# Patient Record
Sex: Male | Born: 1970
Health system: Southern US, Community
[De-identification: ages and names within clinical notes are randomized; demographics above are authoritative.]

## PROBLEM LIST (undated history)

## (undated) DIAGNOSIS — Z8619 Personal history of other infectious and parasitic diseases: Secondary | ICD-10-CM

## (undated) HISTORY — DX: Personal history of other infectious and parasitic diseases: Z86.19

---

## 2007-08-09 HISTORY — PX: SPINE SURGERY: SHX786

## 2007-11-14 ENCOUNTER — Emergency Department: Payer: Self-pay | Admitting: Emergency Medicine

## 2007-11-19 ENCOUNTER — Encounter: Admission: RE | Admit: 2007-11-19 | Discharge: 2007-11-19 | Payer: Self-pay | Admitting: Neurosurgery

## 2007-12-19 ENCOUNTER — Encounter: Admission: RE | Admit: 2007-12-19 | Discharge: 2007-12-19 | Payer: Self-pay | Admitting: Neurosurgery

## 2008-01-02 ENCOUNTER — Encounter: Admission: RE | Admit: 2008-01-02 | Discharge: 2008-01-02 | Payer: Self-pay | Admitting: Neurosurgery

## 2008-02-26 ENCOUNTER — Observation Stay (HOSPITAL_COMMUNITY): Admission: RE | Admit: 2008-02-26 | Discharge: 2008-02-27 | Payer: Self-pay | Admitting: Neurosurgery

## 2010-12-21 NOTE — Op Note (Signed)
NAME:  Evan Kelley, Evan Kelley NO.:  [id number]   MEDICAL RECORD NO.:  [id number]          PATIENT TYPE:  OBV   LOCATION:  3534                         FACILITY:  [facility]   PHYSICIAN:  Hilda Lias, M.D.   DATE OF BIRTH:  1971/06/07   DATE OF PROCEDURE:  02/26/2008  DATE OF DISCHARGE:                               OPERATIVE REPORT   PREOPERATIVE DIAGNOSIS:  Left L4-L5 herniated disk with chronic,  subacute, and acute radiculopathy.   POSTOPERATIVE DIAGNOSIS:  Left L4-L5 herniated disk with chronic,  subacute, and acute radiculopathy.   PROCEDURE:  Left L4-L5 removal of two large fragment of disk.  Foraminotomy.  Microscope.   SURGEON:  Hilda Lias, MD   ASSISTANT:  Stefani Dama, MD.   CLINICAL HISTORY:  The patient was seen in my office for several months  complaints of back and left leg pain associated with weakness of the  left foot.  The patient has failed with conservative treatment.  X-rays  showed that he has a herniated disk at the level of L4-L5 to the left  and incidental to left to right.  Surgery was advised.  The risks were  explained in the history and physical.   PROCEDURE:  The patient was taken to the OR and after intubation, he was  positioned in prone manner.  Back was cleaned with DuraPrep.  X-rays  showed that the needle was at the level L4.  Then, midline incision from  L4-L5 was made and muscle was retracted laterally.  Then with the  microscope, we drilled the lower lamina of L4 on the upper of L5 and  resection of the yellow ligament.  We found that the L5 nerve root was  completely adherent to the floor.  Lysis was accomplished and there was  a piece of osteophyte, which was removed.  Immediately after we were  able to lift the L5 nerve root, we found to large fragment going to the  foramen.  Removal was done without any problem.  We looked at the disk  space and the annulus was completely closed and there was no evidence of  any  hole.  Because of that we decided not to proceed with diskectomy.  Having good decompression of the L5 nerve root and the L4 foraminotomy  was accomplished.  Valsalva maneuver was negative.  Fentanyl and Depo-  Medrol were left in the epidural space and the wound was closed with  Vicryl and Steri-Strips.           ______________________________  Hilda Lias, M.D.     EB/MEDQ  D:  02/26/2008  T:  02/27/2008  Job:  [id number]

## 2010-12-21 NOTE — H&P (Signed)
NAME:  Evan Kelley, Evan Kelley NO.:  [id number]   MEDICAL RECORD NO.:  [id number]          PATIENT TYPE:  OBV   LOCATION:  3534                         FACILITY:  [facility]   PHYSICIAN:  Hilda Lias, M.D.   DATE OF BIRTH:  28-Dec-1970   DATE OF ADMISSION:  02/26/2008  DATE OF DISCHARGE:                              HISTORY & PHYSICAL   HISTORY OF PRESENT ILLNESS:  Evan Kelley is a gentleman who had been  complaining of back pain with radiation down to the left leg associated  with weakness.  The patient had conservative treatment including  medications, physical therapy, and he is not any better.  He came to my  office twice.  He is feeling that the pain is a little bit better in the  left foot, but still remains quite a bit in the left buttock area  associated with weakness.  The patient denies any pain in the right leg.  He wanted to proceed with surgery because of no improvement.   PAST MEDICAL HISTORY:  Negative.   ALLERGIES:  He is not allergic to any medication.   SOCIAL HISTORY:  He drinks socially.  He does not smoke.   FAMILY HISTORY:  Positive for high cholesterol.   REVIEW OF SYSTEMS:  Positive for left leg pain.   PHYSICAL EXAMINATION:  The patient came to my office, he is __________  scoliosis.  HEAD, EARS, NOSE, AND THROAT:  Normal.  NECK:  Normal.  LUNGS:  Clear.  HEART:  Heart sounds normal.  EXTREMITIES:  There were normal strength and normal pulses.  NEUROLOGIC:  Show weak dorsiflexion in the left foot with straight leg  raising being positive at 30 degrees.  He has had decrease of his  symptoms in lumbar spine.   X-ray showed that he has a large herniated disk at L4-L5 to the left and  incidental one at the L4-L5 to his right.   IMPRESSION:  Left L4-L5 herniated disk, and incidental right L5-S1.   RECOMMENDATIONS:  The patient is being admitted for surgery.  He has  failed conservative treatment.  __________ for 5 months and does not  feel any  better.  The procedure would be a left L4-L5 diskectomy.  Surgery was fully explained to he and his wife including the possibility  of no improvement whatsoever, infection, CSF leak.           ______________________________  Hilda Lias, M.D.    EB/MEDQ  D:  02/26/2008  T:  02/26/2008  Job:  [id number]

## 2011-05-06 LAB — CBC
HCT: 40.7
Hemoglobin: 14.2
MCHC: 34.9
MCV: 89.7
Platelets: 230
RBC: 4.54
RDW: 12.6
WBC: 6.8

## 2011-09-13 ENCOUNTER — Ambulatory Visit: Payer: Self-pay | Admitting: Family Medicine

## 2011-09-14 ENCOUNTER — Ambulatory Visit (INDEPENDENT_AMBULATORY_CARE_PROVIDER_SITE_OTHER): Payer: BC Managed Care – PPO | Admitting: Family Medicine

## 2011-09-14 ENCOUNTER — Encounter: Payer: Self-pay | Admitting: Family Medicine

## 2011-09-14 VITALS — BP 140/92 | HR 77 | Temp 97.6°F | Ht 75.0 in | Wt 200.0 lb

## 2011-09-14 DIAGNOSIS — Z0000 Encounter for general adult medical examination without abnormal findings: Secondary | ICD-10-CM

## 2011-09-14 LAB — BASIC METABOLIC PANEL
BUN: 15 mg/dL (ref 6–23)
CO2: 28 mEq/L (ref 19–32)
Calcium: 9.3 mg/dL (ref 8.4–10.5)
Chloride: 104 mEq/L (ref 96–112)
Creatinine, Ser: 1.1 mg/dL (ref 0.4–1.5)
GFR: 77.8 mL/min (ref >60.00)
Glucose, Bld: 83 mg/dL (ref 70–99)
Potassium: 4.2 mEq/L (ref 3.5–5.1)
Sodium: 140 mEq/L (ref 135–145)

## 2011-09-14 LAB — CBC WITH DIFFERENTIAL/PLATELET
Basophils Absolute: 0 10*3/uL (ref 0.0–0.1)
Basophils Relative: 0.4 % (ref 0.0–3.0)
Eosinophils Absolute: 0.1 10*3/uL (ref 0.0–0.7)
Eosinophils Relative: 1 % (ref 0.0–5.0)
HCT: 44.9 % (ref 39.0–52.0)
Hemoglobin: 15.6 g/dL (ref 13.0–17.0)
Lymphocytes Relative: 21.7 % (ref 12.0–46.0)
Lymphs Abs: 1.6 10*3/uL (ref 0.7–4.0)
MCHC: 34.7 g/dL (ref 30.0–36.0)
MCV: 89.5 fl (ref 78.0–100.0)
Monocytes Absolute: 0.4 10*3/uL (ref 0.1–1.0)
Monocytes Relative: 6 % (ref 3.0–12.0)
Neutro Abs: 5.3 10*3/uL (ref 1.4–7.7)
Neutrophils Relative %: 70.9 % (ref 43.0–77.0)
Platelets: 234 10*3/uL (ref 150.0–400.0)
RBC: 5.02 Mil/uL (ref 4.22–5.81)
RDW: 12.3 % (ref 11.5–14.6)
WBC: 7.5 10*3/uL (ref 4.5–10.5)

## 2011-09-14 LAB — HEPATIC FUNCTION PANEL
ALT: 31 U/L (ref 0–53)
AST: 25 U/L (ref 0–37)
Albumin: 4.7 g/dL (ref 3.5–5.2)
Alkaline Phosphatase: 85 U/L (ref 39–117)
Bilirubin, Direct: 0 mg/dL (ref 0.0–0.3)
Total Bilirubin: 0.6 mg/dL (ref 0.3–1.2)
Total Protein: 7.8 g/dL (ref 6.0–8.3)

## 2011-09-14 LAB — LDL CHOLESTEROL, DIRECT: Direct LDL: 97.4 mg/dL

## 2011-09-14 NOTE — Progress Notes (Signed)
  Patient Name: Evan Kelley Date of Birth: 1971-06-23 Age: 41 y.o. Medical Record Number: [id number] Gender: male Date of Encounter: 09/14/2011  History of Present Illness:  Evan Kelley is a 41 y.o. very pleasant male patient who presents with the following:  30 and 51 year old child. Back surgery   Preventative Health Maintenance Visit:  Health Maintenance Summary Reviewed and updated, unless pt declines services.  Tobacco History Reviewed. Alcohol: No concerns, no excessive use Exercise Habits: Some activity, rec at least 30 mins 5 times a week STD concerns: no risk or activity to increase risk Drug Use: None Encouraged self-testicular check  Past Medical History, Surgical History, Social History, Family History, Problem List, Medications, and Allergies have been reviewed and updated if relevant.  Review of Systems:  General: Denies fever, chills, sweats. No significant weight loss. Eyes: Denies blurring,significant itching ENT: Denies earache, sore throat, and hoarseness. Cardiovascular: Denies chest pains, palpitations, dyspnea on exertion Respiratory: Denies cough, dyspnea at rest,wheeezing Breast: no concerns about lumps GI: Denies nausea, vomiting, diarrhea, constipation, change in bowel habits, abdominal pain, melena, hematochezia GU: Denies penile discharge, ED, urinary flow / outflow problems. No STD concerns. Musculoskeletal: Denies back pain, joint pain Derm: Denies rash, itching Neuro: Denies  paresthesias, frequent falls, frequent headaches Psych: Denies depression, anxiety Endocrine: Denies cold intolerance, heat intolerance, polydipsia Heme: Denies enlarged lymph nodes Allergy: No hayfever   Physical Examination: Filed Vitals:   09/14/11 1356  BP: 140/92  Pulse: 77  Temp: 97.6 F (36.4 C)  TempSrc: Oral  Height: 6\' 3"  (1.905 m)  Weight: 200 lb (90.719 kg)  SpO2: 98%    Body mass index is 25.00 kg/(m^2).   Wt Readings from Last 3 Encounters:    09/14/11 200 lb (90.719 kg)    GEN: well developed, well nourished, no acute distress Eyes: conjunctiva and lids normal, PERRLA, EOMI ENT: TM clear, nares clear, oral exam WNL Neck: supple, no lymphadenopathy, no thyromegaly, no JVD Pulm: clear to auscultation and percussion, respiratory effort normal CV: regular rate and rhythm, S1-S2, no murmur, rub or gallop, no bruits, peripheral pulses normal and symmetric, no cyanosis, clubbing, edema or varicosities Chest: no scars, masses GI: soft, non-tender; no hepatosplenomegaly, masses; active bowel sounds all quadrants GU: no hernia, testicular mass, penile discharge, or prostate enlargement Lymph: no cervical, axillary or inguinal adenopathy MSK: gait normal, muscle tone and strength WNL, no joint swelling, effusions, discoloration, crepitus  SKIN: clear, good turgor, color WNL, no rashes, lesions, or ulcerations Neuro: normal mental status, normal strength, sensation, and motion Psych: alert; oriented to person, place and time, normally interactive and not anxious or depressed in appearance.  Assessment and Plan: Health Maint: The patient's preventative maintenance and recommended screening tests for an annual wellness exam were reviewed in full today. Brought up to date unless services declined.  Counselled on the importance of diet, exercise, and its role in overall health and mortality. The patient's FH and SH was reviewed, including their home life, tobacco status, and drug and alcohol status.  Doing well Check labs

## 2014-05-12 ENCOUNTER — Ambulatory Visit (INDEPENDENT_AMBULATORY_CARE_PROVIDER_SITE_OTHER): Payer: BC Managed Care – PPO | Admitting: Family Medicine

## 2014-05-12 ENCOUNTER — Encounter: Payer: Self-pay | Admitting: Family Medicine

## 2014-05-12 VITALS — BP 136/100 | HR 85 | Temp 97.8°F | Wt 205.0 lb

## 2014-05-12 DIAGNOSIS — M5432 Sciatica, left side: Secondary | ICD-10-CM | POA: Insufficient documentation

## 2014-05-12 MED ORDER — HYDROCODONE-ACETAMINOPHEN 5-325 MG PO TABS: 1.0000 | ORAL_TABLET | Freq: Four times a day (QID) | ORAL | Status: DC | PRN

## 2014-05-12 MED ORDER — PREDNISONE 20 MG PO TABS
ORAL_TABLET | ORAL | Status: DC
Start: 2014-05-12 — End: 2015-04-08

## 2014-05-12 NOTE — Patient Instructions (Signed)
Evan Kelley will call about your referral. Stop the ibuprofen.  Start prednisone with food.  Take vicodin as needed. Notify us if not better. If any weakness, to the ER.

## 2014-05-12 NOTE — Progress Notes (Signed)
Pre visit review using our clinic review tool, if applicable. No additional management support is needed unless otherwise documented below in the visit note.  Longstanding h/o back pain.  L buttock pain started in June of this year.  In the last 2-3 weeks, had some pain down the L leg.  No R leg pain.  Now with L foot numbness, a recent change.  No weakness.  Able to walk into clinic today.  No B/B sx.  No FCNAVD.  No injury.  Taking ibuprofen already, not much help.    H/o discectomy 2009.    Meds, vitals, and allergies reviewed.   ROS: See HPI.  Otherwise, noncontributory.  nad ncat rrr ctab Abd soft Back w/o midline pain but muscle tightness in L lower back and lateral L thigh Altered sensation on posterior L leg and L foot.  No weakness in the BLE.  L SLR positive.  Able to bear weight but with pain.

## 2014-05-12 NOTE — Assessment & Plan Note (Signed)
Discectomy hx noted.  Typical sx, no weakness. No red flag sx.  SLR positive.   D/w pt.  Offered referral, had see [facility] prev.  Would get PT set up,vicodin for pain, stop ibuprofen and use pred burst.  Routine cautions on meds.  He agrees.  See instructions. Routed to PCP as FYI.  Patient to update us as needed.  We didn't image today as it wouldn't likely change mgmt.  He agrees.

## 2014-05-15 ENCOUNTER — Telehealth: Payer: Self-pay | Admitting: Family Medicine

## 2014-05-15 ENCOUNTER — Emergency Department: Payer: Self-pay | Admitting: Emergency Medicine

## 2014-05-15 ENCOUNTER — Ambulatory Visit: Payer: BC Managed Care – PPO | Admitting: Family Medicine

## 2014-05-15 NOTE — Telephone Encounter (Signed)
Unfortunate. I have not evaluated. Percocet 10 would be an option.   S/p spine surgery, I would have him see his neurosurgeon promptly to evaluate.

## 2014-05-15 NOTE — Telephone Encounter (Signed)
Spoke with Evan Kelley (wife).  She states Evan Kelley did take 2 Vicodin this morning with no relief.  Evan Kelley feels he does need something stronger for pain. He is not experiencing weakness, he is just in pain.  At this point she states she is thinking about call 911 and have him transported to hospital because she feels he needs a shot of morphine.  She will call us back if she changes her mind about sending him to the hospital.

## 2014-05-15 NOTE — Telephone Encounter (Signed)
Patient Information:  Caller Name: Asher MuirJamie  Phone: [phone]  Patient: Page SpiroKirk, Dyrell  Gender: Male  DOB: Jan 24, 1971  Age: 43 Years  PCP: Hannah Beatopland, [facility](Family Practice)  Office Follow Up:  Does the office need to follow up with this patient?: Yes  Instructions For The Office: see notes, please call pt back  RN Note:  Assured him will send urgent message for MD review. He would like to know if there is a stronger pain med MD could prescribe so that he could get some relief in order to make it out of the house and ride in the car.  Symptoms  Reason For Call & Symptoms: Worsening sciatic pain...seen on Monday 10/5 and has been taking the Prednisone taper and pain medication and woke up early this morning with worsening sxs. Pain is mostly in L buttock/leg and still having same numbness to L foot. States pain med he took this morning has not helped at all and is trying to lay of floor which provides very minimal relief. Had made an appt for this morning at 9:30 but then cancelled it b/c he does not think he can sit in the car for the 15-20 car ride to the office.  Reviewed Health History In EMR: Yes  Reviewed Medications In EMR: Yes  Reviewed Allergies In EMR: Yes  Reviewed Surgeries / Procedures: Yes  Date of Onset of Symptoms: 05/15/2014  Guideline(s) Used:  Back Pain  Disposition Per Guideline:   Go to Office Now  Reason For Disposition Reached:   Severe back pain  Advice Given:  Call Back If:  Bowel/bladder problems occur  You become worse.  Patient Will Follow Care Advice:  YES

## 2014-05-15 NOTE — Telephone Encounter (Signed)
I would recommend neurosurgical consultation in this case with prior discectomy, recalcitrant pain, abnormal neurological exam, numbness. I have not examined the patient, but given this history, I would ask Dr. Jeral FruitBotero to examine the patient.  If the patient cannot cannot be seen at [facility] quickly, then I am happy to evaluate myself.

## 2014-05-15 NOTE — Telephone Encounter (Signed)
Caller: Jamie/Spouse; Phone: [phone]; Reason for Call: Wife calling back for second time this morning b/c hasn't heard back from office yet from first phone call.  Connected wife with clinical staff member, Lupita LeashDonna,  in office to speak with her.

## 2014-05-15 NOTE — Telephone Encounter (Signed)
This is unfortunate.  I am sympathetic.  If he can't move at all due to pain, then the ER would be reasonable.  Routed to PCP as FYI.

## 2014-05-15 NOTE — Telephone Encounter (Signed)
There isn't anything that we can call in stronger.  Is he taking 2 vicodin at a time?  He could take it every 4 hours if needed.  We may need to either get the imaging set up and/or get him over to Dr. Jeral FruitBotero.

## 2014-05-15 NOTE — Telephone Encounter (Signed)
I called pt.  He is back home from [facility] ER.  Imaging was done, his pain meds were changed and he was put on a muscle relaxer per patient report. The pain is some better now.  He needs f/u with Dr. Patsy Lageropland next week.  Please call pt about getting scheduled with Dr. Patsy Lageropland and please see about getting [facility] ER records in the meantime.  Thanks.   Routed to PCP as FYI.

## 2014-05-16 ENCOUNTER — Telehealth: Payer: Self-pay | Admitting: Family Medicine

## 2014-05-16 DIAGNOSIS — M5430 Sciatica, unspecified side: Secondary | ICD-10-CM

## 2014-05-16 NOTE — Telephone Encounter (Signed)
Lm on pts vm requesting a call back 

## 2014-05-16 NOTE — Telephone Encounter (Signed)
Attempted to contact pt. Evan Kelley on pts vm requesting a call back

## 2014-05-16 NOTE — Telephone Encounter (Signed)
I talked with Dr. Patsy Lageropland.  He would like patient to see Dr. Jeral FruitBotero. This is reasonable.  I put in a referral.  Please try to get the ER records from [facility] in the meantime, if they aren't already on the way.  Please notify patient about my conversation with Dr. Patsy Lageropland.  If the patient can't get seen by Dr. Jeral FruitBotero in the near future, then please notify me.  Routed to PCP as a FYI.  Thanks.

## 2014-05-19 ENCOUNTER — Ambulatory Visit (INDEPENDENT_AMBULATORY_CARE_PROVIDER_SITE_OTHER): Payer: BC Managed Care – PPO | Admitting: Family Medicine

## 2014-05-19 ENCOUNTER — Encounter: Payer: Self-pay | Admitting: Family Medicine

## 2014-05-19 VITALS — BP 120/90 | HR 74 | Temp 97.8°F | Ht 75.0 in | Wt 205.5 lb

## 2014-05-19 DIAGNOSIS — M2142 Flat foot [pes planus] (acquired), left foot: Secondary | ICD-10-CM

## 2014-05-19 DIAGNOSIS — R200 Anesthesia of skin: Secondary | ICD-10-CM

## 2014-05-19 DIAGNOSIS — R208 Other disturbances of skin sensation: Secondary | ICD-10-CM

## 2014-05-19 DIAGNOSIS — M5432 Sciatica, left side: Secondary | ICD-10-CM

## 2014-05-19 DIAGNOSIS — R29898 Other symptoms and signs involving the musculoskeletal system: Secondary | ICD-10-CM

## 2014-05-19 MED ORDER — OXYCODONE-ACETAMINOPHEN 5-325 MG PO TABS: 1.0000 | ORAL_TABLET | Freq: Four times a day (QID) | ORAL | Status: DC | PRN

## 2014-05-19 NOTE — Telephone Encounter (Signed)
Left message on voice mail  to call back

## 2014-05-19 NOTE — Progress Notes (Signed)
Dr. Karleen HampshireSpencer T. Nery Frappier, MD, CAQ Sports Medicine [facility] [address] [place], [id number] Phone: [phone] Fax: [phone]  05/19/2014  Patient: Evan NordmannRyan W Kelley, MRN: [id number], DOB: 08-16-1970, 43 y.o.  Primary Physician:  Hannah BeatSpencer Lucely Leard, MD  Chief Complaint: Back Pain  Subjective:   Evan NordmannRyan W Winegarden is a 43 y.o. very pleasant male patient who presents with the following: Back Pain  ongoing for approximately: 1 week The patient has had back pain before with history of L4-5 discectomy by Dr. Jeral FruitBotero. The back pain is localized into the lumbar spine area. They also describe radiculopathy, numbness, and weakness in the left leg.  Went to go see Dr. Chriss Czarumayne in [place]. E. Stim. Decompression.  Pain is mostly gone.  + numbness or tingling. No bowel or bladder incontinence. + focal weakness on the L. Prior interventions: L4-5 discectomy, on pred taper now, on zanaflex, percocet Physical therapy: No Chiropractic manipulations: Yes, but no manipulation, modalities and traction Acupuncture: No Osteopathic manipulation: No Heat or cold: Minimal effect  Past Medical History, Surgical History, Family History, Medications, Allergies have been reviewed and updated if relevant.  GEN: No fevers, chills. Nontoxic. Primarily MSK c/o today. MSK: Detailed in the HPI GI: tolerating PO intake without difficulty Neuro: As above  Otherwise the pertinent positives of the ROS are noted above.    Objective:   Blood pressure 120/90, pulse 74, temperature 97.8 F (36.6 C), temperature source Oral, height 6\' 3"  (1.905 m), weight 205 lb 8 oz (93.214 kg).  Gen: Well-developed,well-nourished,in no acute distress; alert,appropriate and cooperative throughout examination HEENT: Normocephalic and atraumatic without obvious abnormalities.  Ears, externally no deformities Pulm: Breathing comfortably in no respiratory distress Range of motion at  the waist: Flexion, rotation  and lateral bending: limited flexion to 50 deg, ext with pain, lateral bending relatively normal. Rotation minimally impaired  No echymosis or edema Rises to examination table with mild difficulty Gait: minimally antalgic  Inspection/Deformity: No abnormality Paraspinus T:  Minimally tender diffusely  B Ankle Dorsiflexion (L5,4): 5/5 B Great Toe Dorsiflexion (L5,4): 3+/5 on the L Heel Walk (L5): WNL Toe Walk (S1): UNABLE TO COMPLETE ON THE LEFT Rise/Squat (L4): WNL, mild pain  SENSORY B Medial Foot (L4): WNL B Dorsum (L5): WNL B Lateral (S1): DECREASED Light Touch: DECREASED Pinprick: DECREASED  REFLEXES Knee (L4): 2+ Ankle (S1): 2+  B SLR, seated: POS B SLR, supine: POS B FABER: neg B Reverse FABER: neg B Greater Troch: NT B Log Roll: neg B Stork: NT B Sciatic Notch: NT  Radiology: CLINICAL DATA: Acute low back pain after swimming.  EXAM: CT LUMBAR SPINE WITHOUT CONTRAST  TECHNIQUE: Multidetector CT imaging of the lumbar spine was performed without intravenous contrast administration. Multiplanar CT image reconstructions were also generated.  COMPARISON: None.  FINDINGS: No fracture or spondylolisthesis is noted. Moderate degenerative disc disease is noted at L4-5 and L5-S1 with minimal posterior osteophyte formation. Posterior facet joints appear normal. Dense material is noted within the L3-4 disc space which may represent old injected contrast from prior discogram. Moderate to severe right neural foraminal stenosis is noted at L5-S1 secondary to posterior osteophyte formation. No other significant central spinal canal or neural foraminal stenosis is noted.  IMPRESSION: Moderate degenerative disc disease is noted at L4-5 and L5-S1.  Moderate to severe right neural foraminal stenosis is noted at L5-S1 secondary to posterior osteophyte formation.  Dense material is noted within the L3-4 disc space which may represent old injected contrast  from prior  discogram.  No acute fracture or spondylolisthesis is noted.   Electronically Signed By: Roque LiasJames Green M.D. On: 05/15/2014 15:04   Assessment and Plan:   Left sided sciatica  Weakness of foot, left  Numbness of left foot  Exam and history most c/w acute disc herniation, more likely L5-S1 given pattern of numbness and weakness. Improving on prednisone and with traction, pain much better.   With distinctly altered neurological exam, we are going to have him see Dr. Jeral FruitBotero, who did the patient's prior discectomy for opinion.  Continue modalities, traction. Percocet and zanaflex prn.  Signed,  Elpidio GaleaSpencer T. Annye Forrey, MD   Patient's Medications  New Prescriptions   No medications on file  Previous Medications   IBUPROFEN (ADVIL,MOTRIN) 200 MG TABLET    Take 600 mg by mouth every 6 (six) hours as needed.   MULTIPLE VITAMIN (MULTIVITAMIN) TABLET    Take 1 tablet by mouth daily.   PREDNISONE (DELTASONE) 20 MG TABLET    Take 3 a day for 3 days, then 2 a day for 3 days, then 1 a day for 3 days, then 0.5 a day for 4 days. With food.   TIZANIDINE (ZANAFLEX) 4 MG TABLET    Take 8 mg by mouth every 8 (eight) hours as needed for muscle spasms.  Modified Medications   Modified Medication Previous Medication   OXYCODONE-ACETAMINOPHEN (PERCOCET/ROXICET) 5-325 MG PER TABLET oxyCODONE-acetaminophen (PERCOCET/ROXICET) 5-325 MG per tablet      Take 1-2 tablets by mouth every 6 (six) hours as needed for severe pain.    Take 1-2 tablets by mouth every 6 (six) hours as needed for severe pain.  Discontinued Medications   HYDROCODONE-ACETAMINOPHEN (NORCO/VICODIN) 5-325 MG PER TABLET    Take 1 tablet by mouth every 6 (six) hours as needed for moderate pain (sedation caution).

## 2014-05-19 NOTE — Progress Notes (Signed)
Pre visit review using our clinic review tool, if applicable. No additional management support is needed unless otherwise documented below in the visit note. 

## 2014-05-19 NOTE — Telephone Encounter (Signed)
Spoke to patient to notify him of Sr. Duncan's comments. Patient verbalized understanding. Patient stated he has an appointment with Dr. Patsy Lageropland later on today to discuss this matter further.

## 2014-06-09 ENCOUNTER — Other Ambulatory Visit (HOSPITAL_COMMUNITY): Payer: Self-pay | Admitting: Neurosurgery

## 2014-06-09 DIAGNOSIS — M5416 Radiculopathy, lumbar region: Secondary | ICD-10-CM

## 2014-06-09 DIAGNOSIS — R531 Weakness: Secondary | ICD-10-CM

## 2014-06-12 ENCOUNTER — Ambulatory Visit: Payer: Self-pay | Admitting: Neurosurgery

## 2015-04-02 ENCOUNTER — Telehealth: Payer: Self-pay | Admitting: Family Medicine

## 2015-04-02 ENCOUNTER — Other Ambulatory Visit (INDEPENDENT_AMBULATORY_CARE_PROVIDER_SITE_OTHER): Payer: BLUE CROSS/BLUE SHIELD

## 2015-04-02 DIAGNOSIS — Z13220 Encounter for screening for lipoid disorders: Secondary | ICD-10-CM

## 2015-04-02 LAB — LIPID PANEL
Cholesterol: 182 mg/dL (ref 0–200)
HDL: 44 mg/dL (ref >39.00)
LDL Cholesterol: 100 mg/dL — ABNORMAL HIGH (ref 0–99)
NonHDL: 138.04
Total CHOL/HDL Ratio: 4
Triglycerides: 189 mg/dL — ABNORMAL HIGH (ref 0.0–149.0)
VLDL: 37.8 mg/dL (ref 0.0–40.0)

## 2015-04-02 LAB — COMPREHENSIVE METABOLIC PANEL
ALT: 32 U/L (ref 0–53)
AST: 31 U/L (ref 0–37)
Albumin: 4.5 g/dL (ref 3.5–5.2)
Alkaline Phosphatase: 67 U/L (ref 39–117)
BUN: 18 mg/dL (ref 6–23)
CO2: 29 mEq/L (ref 19–32)
Calcium: 9.7 mg/dL (ref 8.4–10.5)
Chloride: 105 mEq/L (ref 96–112)
Creatinine, Ser: 1.11 mg/dL (ref 0.40–1.50)
GFR: 77.21 mL/min (ref >60.00)
Glucose, Bld: 71 mg/dL (ref 70–99)
Potassium: 4 mEq/L (ref 3.5–5.1)
Sodium: 142 mEq/L (ref 135–145)
Total Bilirubin: 0.8 mg/dL (ref 0.2–1.2)
Total Protein: 7.5 g/dL (ref 6.0–8.3)

## 2015-04-02 NOTE — Telephone Encounter (Signed)
-----   Message from Baldomero Lamy sent at 04/02/2015  2:00 PM EDT ----- Regarding: Dr. Cyndie Chime pt had labs today, need orders please :-) We didn't get a response back from Dr Patsy Lager on orders for this pt, could you order his cpx labs please?  Thanks Rodney Booze

## 2015-04-08 ENCOUNTER — Encounter: Payer: Self-pay | Admitting: Family Medicine

## 2015-04-08 ENCOUNTER — Ambulatory Visit (INDEPENDENT_AMBULATORY_CARE_PROVIDER_SITE_OTHER): Payer: BLUE CROSS/BLUE SHIELD | Admitting: Family Medicine

## 2015-04-08 VITALS — BP 124/70 | HR 76 | Temp 98.0°F | Ht 75.0 in | Wt 194.0 lb

## 2015-04-08 DIAGNOSIS — Z23 Encounter for immunization: Secondary | ICD-10-CM | POA: Diagnosis not present

## 2015-04-08 DIAGNOSIS — Z0000 Encounter for general adult medical examination without abnormal findings: Secondary | ICD-10-CM | POA: Diagnosis not present

## 2015-04-08 DIAGNOSIS — R29898 Other symptoms and signs involving the musculoskeletal system: Secondary | ICD-10-CM

## 2015-04-08 DIAGNOSIS — M2142 Flat foot [pes planus] (acquired), left foot: Secondary | ICD-10-CM

## 2015-04-08 NOTE — Progress Notes (Signed)
Pre visit review using our clinic review tool, if applicable. No additional management support is needed unless otherwise documented below in the visit note. 

## 2015-04-08 NOTE — Patient Instructions (Signed)

## 2015-04-08 NOTE — Progress Notes (Signed)
Dr. Karleen Hampshire T. Maridee Slape, MD, CAQ Sports Medicine [facility] Sports [facility] [address] [place], [address] Phone: [phone] Fax: [phone]  04/08/2015  Patient: Evan Kelley, MRN: [id number], DOB: July 28, 1971, 44 y.o.  Primary Physician:  Hannah Beat, MD  Chief Complaint: Annual Exam  Subjective:   Evan Kelley is a 44 y.o. pleasant patient who presents with the following:  Preventative Health Maintenance Visit:  Health Maintenance Summary Reviewed and updated, unless pt declines services.  Tobacco History Reviewed. Alcohol: No concerns, no excessive use Exercise Habits: Some activity, rec at least 30 mins 5 times a week - LE is limiting him on the L STD concerns: no risk or activity to increase risk Drug Use: None Encouraged self-testicular check  Went to [place].  S/p diskectomy  Still have some numbness in foot and calf.   L push-off Numbness  PT   Health Maintenance  Topic Date Due  . HIV Screening  03/29/1988  . TETANUS/TDAP  03/29/1992  . INFLUENZA VACCINE  03/08/2016   Immunization History  Administered Date(s) Administered  . Influenza,inj,Quad PF,36+ Mos 04/08/2015   Patient Active Problem List   Diagnosis Date Noted  . Left sided sciatica 05/12/2014   Past Medical History  Diagnosis Date  . History of chicken pox    Past Surgical History  Procedure Laterality Date  . Spine surgery  2009    diskectomy, L5 [place])   Social History   Social History  . Marital Status: Married    Spouse Name: N/A  . Number of Children: N/A  . Years of Education: N/A   Occupational History  . professor [employer]    Professor, Environmental consultant and Environmental Studies   Social History Main Topics  . Smoking status: Never Smoker   . Smokeless tobacco: Never Used  . Alcohol Use: Yes     Comment: occassional beer or wine  . Drug Use: No  . Sexual Activity: Not on file   Other Topics Concern  . Not on file   Social  History Narrative   Family History  Problem Relation Age of Onset  . Alcohol abuse      parent  . Hyperlipidemia    . Coronary artery disease      GGF  . Hypertension     No Known Allergies  Medication list has been reviewed and updated.   General: Denies fever, chills, sweats. No significant weight loss. Eyes: Denies blurring,significant itching ENT: Denies earache, sore throat, and hoarseness. Cardiovascular: Denies chest pains, palpitations, dyspnea on exertion Respiratory: Denies cough, dyspnea at rest,wheeezing Breast: no concerns about lumps GI: Denies nausea, vomiting, diarrhea, constipation, change in bowel habits, abdominal pain, melena, hematochezia GU: Denies penile discharge, ED, urinary flow / outflow problems. No STD concerns. Musculoskeletal: Denies back pain, joint pain Derm: Denies rash, itching Neuro: AS ABOVE Psych: Denies depression, anxiety Endocrine: Denies cold intolerance, heat intolerance, polydipsia Heme: Denies enlarged lymph nodes Allergy: No hayfever  Objective:   BP 124/70 mmHg  Pulse 76  Temp(Src) 98 F (36.7 C) (Oral)  Ht 6\' 3"  (1.905 m)  Wt 194 lb (87.998 kg)  BMI 24.25 kg/m2 Ideal Body Weight: Weight in (lb) to have BMI = 25: 199.6  No exam data present  GEN: well developed, well nourished, no acute distress Eyes: conjunctiva and lids normal, PERRLA, EOMI ENT: TM clear, nares clear, oral exam WNL Neck: supple, no lymphadenopathy, no thyromegaly, no JVD Pulm: clear to auscultation and percussion, respiratory effort normal  CV: regular rate and rhythm, S1-S2, no murmur, rub or gallop, no bruits, peripheral pulses normal and symmetric, no cyanosis, clubbing, edema or varicosities GI: soft, non-tender; no hepatosplenomegaly, masses; active bowel sounds all quadrants GU: no hernia, testicular mass, penile discharge Lymph: no cervical, axillary or inguinal adenopathy MSK: gait normal, muscle tone and strength WNL, no joint swelling,  effusions, discoloration, crepitus  SKIN: clear, good turgor, color WNL, no rashes, lesions, or ulcerations Neuro: normal mental status. DECREASED PUSH OFF AND LATERAL FOOT AND LEG SENSATION ON THE LEFT Psych: alert; oriented to person, place and time, normally interactive and not anxious or depressed in appearance. All labs reviewed with patient.  Lipids:    Component Value Date/Time   CHOL 182 04/02/2015 1509   TRIG 189.0* 04/02/2015 1509   HDL 44.00 04/02/2015 1509   LDLDIRECT 97.4 09/14/2011 1436   VLDL 37.8 04/02/2015 1509   CHOLHDL 4 04/02/2015 1509   CBC: CBC Latest Ref Rng 09/14/2011 02/21/2008  WBC 4.5 - 10.5 K/uL 7.5 6.8  Hemoglobin 13.0 - 17.0 g/dL 15.6 14.2  Hematocrit 39.0 - 52.0 % 44.9 40.7  Platelets 150.0 - 400.0 K/uL 234.0 132    Basic Metabolic Panel:    Component Value Date/Time   NA 142 04/02/2015 1509   K 4.0 04/02/2015 1509   CL 105 04/02/2015 1509   CO2 29 04/02/2015 1509   BUN 18 04/02/2015 1509   CREATININE 1.11 04/02/2015 1509   GLUCOSE 71 04/02/2015 1509   CALCIUM 9.7 04/02/2015 1509   Hepatic Function Latest Ref Rng 04/02/2015 09/14/2011  Total Protein 6.0 - 8.3 g/dL 7.5 7.8  Albumin 3.5 - 5.2 g/dL 4.5 4.7  AST 0 - 37 U/L 31 25  ALT 0 - 53 U/L 32 31  Alk Phosphatase 39 - 117 U/L 67 85  Total Bilirubin 0.2 - 1.2 mg/dL 0.8 0.6  Bilirubin, Direct 0.0 - 0.3 mg/dL - 0.0    No results found for: TSH No results found for: PSA  Assessment and Plan:   Healthcare maintenance  Need for prophylactic vaccination and inoculation against influenza - Plan: Flu Vaccine QUAD 36+ mos IM  Weakness of foot, left - Plan: Ambulatory referral to Physical Therapy  Health Maintenance Exam: The patient's preventative maintenance and recommended screening tests for an annual wellness exam were reviewed in full today. Brought up to date unless services declined.  Counselled on the importance of diet, exercise, and its role in overall health and mortality. The  patient's FH and SH was reviewed, including their home life, tobacco status, and drug and alcohol status.  Follow-up: No Follow-up on file. Unless noted, follow-up in 1 year for Health Maintenance Exam.  New Prescriptions   No medications on file   Orders Placed This Encounter  Procedures  . Flu Vaccine QUAD 36+ mos IM  . Ambulatory referral to Physical Therapy    Signed,  Frederico Hamman T. Rayvon Dakin, MD   Patient's Medications  New Prescriptions   No medications on file  Previous Medications   IBUPROFEN (ADVIL,MOTRIN) 200 MG TABLET    Take 600 mg by mouth every 6 (six) hours as needed.   MULTIPLE VITAMIN (MULTIVITAMIN) TABLET    Take 1 tablet by mouth daily.  Modified Medications   No medications on file  Discontinued Medications   OXYCODONE-ACETAMINOPHEN (PERCOCET/ROXICET) 5-325 MG PER TABLET    Take 1-2 tablets by mouth every 6 (six) hours as needed for severe pain.   PREDNISONE (DELTASONE) 20 MG TABLET    Take 3  a day for 3 days, then 2 a day for 3 days, then 1 a day for 3 days, then 0.5 a day for 4 days. With food.   TIZANIDINE (ZANAFLEX) 4 MG TABLET    Take 8 mg by mouth every 8 (eight) hours as needed for muscle spasms.

## 2015-04-29 ENCOUNTER — Ambulatory Visit: Payer: BLUE CROSS/BLUE SHIELD | Attending: Family Medicine

## 2015-04-29 DIAGNOSIS — R29898 Other symptoms and signs involving the musculoskeletal system: Secondary | ICD-10-CM | POA: Diagnosis present

## 2015-04-29 DIAGNOSIS — R269 Unspecified abnormalities of gait and mobility: Secondary | ICD-10-CM | POA: Insufficient documentation

## 2015-04-30 NOTE — Patient Instructions (Signed)
[email or web address] SLR 2x10 Single leg bridge 2x10 Sciatic nerve glide x10 Standing calf stretch 2x30 sec Dead bug with 3 sec contraction x10

## 2015-04-30 NOTE — Therapy (Signed)
[facility] MAIN [facility] SERVICES [address] [place], [place], [address] Phone: [phone]   Fax:  [phone]  Physical Therapy Evaluation  Patient Details  Name: Evan Kelley MRN: [id number] Date of Birth: [birth year] Referring Provider:  Hannah Beat, MD  Encounter Date: 04/29/2015      PT End of Session - 04/30/15 1052    Visit Number 1   Number of Visits 9   Date for PT Re-Evaluation 05/27/15   PT Start Time 1300   PT Stop Time 1400   PT Time Calculation (min) 60 min   Activity Tolerance Patient tolerated treatment well   Behavior During Therapy [facility] for tasks assessed/performed      Past Medical History  Diagnosis Date  . History of chicken pox     Past Surgical History  Procedure Laterality Date  . Spine surgery  2009    diskectomy, L5 ([place])    There were no vitals filed for this visit.  Visit Diagnosis:  Weakness of left lower extremity  Abnormality of gait      Subjective Assessment - 04/30/15 1242    Subjective Pt reports he ruptured a disc in October 2015 while swimming.  He was having sciatic pain on his left LE and numbness in his lower leg and foot.  He had surgery in November of last year which resolved his sciatic pain but not the numbness in his lower leg or weakness in his calf.  Pt was told the numbness and strength would take ~6 months recover due to slow nerve healing rate.  Pt notes no improvement in his lower leg symptoms since the surgery and is having trouble with plantarflexion during gait.  He occasionally has lower leg cramps at night and has to walk to loosen his muscles, which has been going on since 2009.  Pt has constant numbness which worsens with extended exercise and extended standing and has to elevate his legs when he has pins and needles sensation.  He has a follow-up with MD last month who referred for PT and has not had nerve conduction test performed.  PT did not have PT after his  2015 surgery and did his own exercises he learned during PT in 2009 for his first back surgery.       Patient Stated Goals pt would like to learn how to live with his symptoms, improve his gait and decrease stress on his right knee from over compensating.     Currently in Pain? Yes  numbness    Pain Location Leg   Pain Orientation Left;Lower   Pain Descriptors / Indicators Numbness            [facility] PT Assessment - 04/30/15 0001    Assessment   Medical Diagnosis weakness of left foot   Onset Date/Surgical Date 06/08/14   Hand Dominance Right   Prior Therapy none   Precautions   Precautions None   Restrictions   Weight Bearing Restrictions No   Balance Screen   Has the patient fallen in the past 6 months No   Has the patient had a decrease in activity level because of a fear of falling?  Yes   Is the patient reluctant to leave their home because of a fear of falling?  No   Home Nurse, mental health Private residence   Living Arrangements Spouse/significant other;Children   Available Help at Discharge Family   Type of Home House   Home Access Stairs  to enter   Entrance Stairs-Number of Steps 5   Entrance Stairs-Rails Right   Home Layout Two level   Alternate Level Stairs-Number of Steps 20   Alternate Level Stairs-Rails Right   Home Equipment None   Prior Function   Level of Independence Independent   Vocation Full time employment   Vocation Requirements Professor   Cognition   Overall Cognitive Status Within Functional Limits for tasks assessed   Sensation   Light Touch Impaired by gross assessment   Coordination   Gross Motor Movements are Fluid and Coordinated Yes        PAIN: 0/10  POSTURE: Upright posture in sitting  AROM: Lumbar extension: 25% limited with aberrant movement  Dorsiflexion in long sitting on Left: less than neutral  STRENGTH:  Graded on a 0-5 scale Muscle Group Left Right  Hip Flex 4 4+  Hip Abd 4 4+  Hip Add    Hip Ext  4 4  Hip IR/ER    Knee Flex 4 5  Knee Ext 5 5  Ankle DF 5 5  Ankle PF 2 5   Dermatomes : Decreased on left: L4, L5, S1 Myotomes: impaired S1 (L)  Reflex: Knee Jerk Right: 2+ Knee jerk Left: 3+  SPECIAL TESTS: Neuro tension: + on right  Repeated extension in standing and prone press ups x10: no change in LE symptoms no change in motor control  GAIT: Pt with increased knee flexion/hip flexion during swing phase on left LE and lands with hard landing on left LE (slap foot) reduced toe off/push off L  Palpation: Increased tension and myofascial restrictions in left calf Calf circumference: R 42 cm and Left 39 cm (32 from lateral malleolus)   There ex:  SLR x10 Bridge x10 Single leg bridge x10 Sciatic nerve glide x10 Standing calf stretch 2x30 sec Dead bug with 3 sec contraction x10 Pt required verbal cueing for correct technique                      PT Education - 04/30/15 1051    Education provided Yes   Education Details plan of care, HEP    Person(s) Educated Patient   Methods Explanation;Demonstration   Comprehension Verbalized understanding;Returned demonstration             PT Long Term Goals - 04/30/15 1713    PT LONG TERM GOAL #1   Title pt will be independent with HEP to improve core strength for improved functional movements.   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 Pt presents with decreased active lumbar extension, displays signs suggestive of decreased core strength, decreased plantarflexion strength, decreased hip strength, tight calf musculature, and abnormal gait.  Based history and exam, pt would benefit from skilled PT services to improve deficits to decrease stress on his body from compensating for his decreased left plantar flexion strength.     Pt will benefit from skilled therapeutic intervention in order to improve on the following deficits Abnormal gait;Decreased strength;Impaired sensation;Decreased range of motion;Increased fascial restricitons;Impaired flexibility   Rehab Potential Good   PT Frequency 2x / week   PT Duration 4 weeks   PT Treatment/Interventions Aquatic Therapy;Electrical Stimulation;Therapeutic exercise;Therapeutic activities;Functional mobility training;Stair training;Gait training;Balance training;Neuromuscular re-education;Manual techniques;Dry needling   PT Next Visit Plan progress HEP         Problem List Patient Active Problem List   Diagnosis Date Noted  . Left sided sciatica 05/12/2014   Janus Molder, SPT This entire session was performed under direct supervision and direction of a licensed therapist/therapist assistant . I have personally read, edited and approve of the note as written. Carlyon Shadow. Tortorici, PT, DPT [phone]  Tortorici,Ashley 04/30/2015, 7:21 PM  [facility] MAIN [facility] SERVICES [address] [place], [place], [address] Phone: [phone]   Fax:  [phone]

## 2015-05-06 ENCOUNTER — Ambulatory Visit: Payer: BLUE CROSS/BLUE SHIELD | Admitting: Physical Therapy

## 2015-05-06 ENCOUNTER — Encounter: Payer: Self-pay | Admitting: Physical Therapy

## 2015-05-06 DIAGNOSIS — R29898 Other symptoms and signs involving the musculoskeletal system: Secondary | ICD-10-CM

## 2015-05-06 DIAGNOSIS — R269 Unspecified abnormalities of gait and mobility: Secondary | ICD-10-CM

## 2015-05-06 NOTE — Therapy (Signed)
[facility] MAIN [facility] SERVICES [address] [place], [place], [address] Phone: [phone]   Fax:  [phone]  Physical Therapy Treatment  Patient Details  Name: Evan Kelley MRN: [id number] Date of Birth: [birth year] Referring Provider:  Hannah Beat, MD  Encounter Date: 05/06/2015      PT End of Session - 05/06/15 1310    Visit Number 2   Number of Visits 9   Date for PT Re-Evaluation 05/27/15   PT Start Time 0105   PT Stop Time 0250   PT Time Calculation (min) 105 min   Activity Tolerance Patient tolerated treatment well   Behavior During Therapy [facility] for tasks assessed/performed      Past Medical History  Diagnosis Date  . History of chicken pox     Past Surgical History  Procedure Laterality Date  . Spine surgery  2009    diskectomy, L5 ([place])    There were no vitals filed for this visit.  Visit Diagnosis:  Weakness of left lower extremity  Abnormality of gait      Subjective Assessment - 05/06/15 1309    Subjective Patient is having difficult time with going down steps and would like HEP for core strengthening.    Patient Stated Goals pt would like to learn how to live with his symptoms, improve his gait and decrease stress on his right knee from over compensating.     Currently in Pain? No/denies       Therapeutic exercise; BOSU ball flat side up, flat side down and mini squats x 10 BAPS fwd/bwd, side to side and circles x 10 GTB TA contraction trunk rotation left and right x 10 GTB w/ arm flex with TA x 10 hooklying clam with GTB x 20  hooklying heel slides, alternating heel slides BLE, double knee flex/ext with 25 % extension  Ankle with eversioon and GTB x 10 Ankle DF with GTB x 10 Patient has no pain and has weakness in RLE calf and is unable to perform a single leg toe raise.  Patient is able to perform all exercises with  Minimal cuing                          PT  Education - 05/06/15 1310    Education provided Yes   Education Details HEP   Person(s) Educated Patient   Methods Explanation   Comprehension Verbalized understanding             PT Long Term Goals - 04/30/15 1713    PT LONG TERM GOAL #1   Title pt will be independent with HEP to improve core strength for improved functional movements.   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    Pt will benefit from skilled therapeutic intervention in order to improve on the following deficits Abnormal gait;Decreased strength;Impaired sensation;Decreased range of motion;Increased fascial restricitons;Impaired flexibility   Rehab Potential Good   PT Frequency 2x / week   PT Duration 4 weeks   PT Treatment/Interventions Aquatic Therapy;Electrical Stimulation;Therapeutic exercise;Therapeutic activities;Functional mobility training;Stair training;Gait training;Balance training;Neuromuscular re-education;Manual techniques;Dry needling   PT Next Visit Plan progress HEP        Problem List Patient Active Problem List   Diagnosis Date Noted  . Left sided sciatica 05/12/2014    Ezekiel Ina 05/06/2015, 1:59 PM   [facility] MAIN [facility] SERVICES [address] [place], [place], [address] Phone: [phone]   Fax:  [phone]

## 2015-05-08 ENCOUNTER — Encounter: Payer: BLUE CROSS/BLUE SHIELD | Admitting: Physical Therapy

## 2015-05-11 ENCOUNTER — Ambulatory Visit: Payer: BLUE CROSS/BLUE SHIELD | Admitting: Physical Therapy

## 2015-05-13 ENCOUNTER — Encounter: Payer: BLUE CROSS/BLUE SHIELD | Admitting: Physical Therapy

## 2015-05-20 ENCOUNTER — Encounter: Payer: Self-pay | Admitting: Physical Therapy

## 2015-05-20 ENCOUNTER — Ambulatory Visit: Payer: BLUE CROSS/BLUE SHIELD | Attending: Family Medicine | Admitting: Physical Therapy

## 2015-05-20 DIAGNOSIS — R29898 Other symptoms and signs involving the musculoskeletal system: Secondary | ICD-10-CM

## 2015-05-20 DIAGNOSIS — R269 Unspecified abnormalities of gait and mobility: Secondary | ICD-10-CM | POA: Insufficient documentation

## 2015-05-20 NOTE — Therapy (Signed)
[facility] [address] [place], [place], [address] Phone: [phone]   Fax:  [phone]  Physical Therapy Treatment  Patient Details  Name: Evan Kelley MRN: [id number] Date of Birth: [birth year] Referring Provider:  Hannah Beatopland, Spencer, MD  Encounter Date: 05/20/2015      PT End of Session - 05/20/15 1244    Visit Number 3   Number of Visits 9   Date for PT Re-Evaluation 05/27/15   PT Start Time 1120   PT Stop Time 1202   PT Time Calculation (min) 42 min   Activity Tolerance Patient tolerated treatment well   [facility] tasks assessed/performed      Past Medical History  Diagnosis Date  . History of chicken pox     Past Surgical History  Procedure Laterality Date  . Spine surgery  2009    diskectomy, L5 ([place])    There were no vitals filed for this visit.  Visit Diagnosis:  Weakness of left lower extremity  Abnormality of gait      Subjective Assessment - 05/20/15 1126    Subjective Patient reports that he is doing well. He states that he has not seen any difference in plantar flexion strength, but he states that he home exercises are going well.    Patient Stated Goals pt would like to learn how to live with his symptoms, improve his gait and decrease stress on his right knee from over compensating.     Currently in Pain? No/denies   Pain Location Leg   Pain Orientation Left;Lower   Pain Descriptors / Indicators Numbness          Therapeutic exercise; BOSU ball flat side up, flat side down and mini squats x 10 LLE BAPS fwd/bwd, x 15  LLE BAPS side to side x 15  LLE BAPS circles x 15   Ankle with eversion and Blue TBand x 10 Ankle DF with Blue TBand x 10 Ankle PF with blue tband  X 10   Hook lying bridges x 12  Hook lying bridges with knee extension x 10 each LE  Sustained bridge with knee flexion x 6 each LE  hooklying clam with Blue TBand x 12  B TBand   Deep core contraction trunk rotation left and right x 12 B TBand arm flex/ext with deep core activation x 12   BLE leg press on quantum press 130# x 12  L LE leg press on quantum press 90# x 12    PT provided min Verbal instruction for proper exercise set up and positioning as well as cues for proper speed of movement to improve strengthening aspect of exercise. Cues also provided to reduce compensation of hip for ankle movements. Patient responded very well to instruction.                              PT Long Term Goals - 04/30/15 1713    PT LONG TERM GOAL #1   Title pt will be independent with HEP to improve core strength for improved functional movements.   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 PT provided min verbal instruction for proper exercise set up, improved deep core activation with proper diaphragmatic breathing pattern, and for proper speed of movement with therex. Patient responded very to instruction. Resistance was increased for LE strengthening exercises from green tband to Blue tband; patient tolerated increase very well. Continued skilled PT is recommended to increase LE strength and improve gait to allow patient to improve quality of life.   Pt will benefit from skilled therapeutic intervention in order to improve on the following  deficits Abnormal gait;Decreased strength;Impaired sensation;Decreased range of motion;Increased fascial restricitons;Impaired flexibility   Rehab Potential Good   PT Frequency 2x / week   PT Duration 4 weeks   PT Treatment/Interventions Aquatic Therapy;Electrical Stimulation;Therapeutic exercise;Therapeutic activities;Functional mobility training;Stair training;Gait training;Balance training;Neuromuscular re-education;Manual techniques;Dry needling   PT Next Visit Plan increased HEP         Problem List Patient Active Problem List   Diagnosis Date Noted  . Left sided sciatica 05/12/2014   Grier Rocher SPT 05/20/2015   5:11 PM   Hopkins,Margaret 05/20/2015, 5:11 PM  [facility] MAIN [facility] SERVICES [address] [place], [place], [address] Phone: [phone]   Fax:  [phone]

## 2015-05-22 ENCOUNTER — Encounter: Payer: BLUE CROSS/BLUE SHIELD | Admitting: Physical Therapy

## 2015-05-25 ENCOUNTER — Encounter: Payer: BLUE CROSS/BLUE SHIELD | Admitting: Physical Therapy

## 2015-05-27 ENCOUNTER — Ambulatory Visit: Payer: BLUE CROSS/BLUE SHIELD | Admitting: Physical Therapy

## 2015-05-27 ENCOUNTER — Encounter: Payer: Self-pay | Admitting: Physical Therapy

## 2015-05-27 DIAGNOSIS — R269 Unspecified abnormalities of gait and mobility: Secondary | ICD-10-CM

## 2015-05-27 DIAGNOSIS — R29898 Other symptoms and signs involving the musculoskeletal system: Secondary | ICD-10-CM | POA: Diagnosis not present

## 2015-05-27 NOTE — Therapy (Signed)
[place] MAIN [facility] SERVICES [address] [place], [place], [address] Phone: [phone]   Fax:  [phone]  Physical Therapy Treatment/ discharge summary.   Patient Details  Name: Evan Kelley MRN: [id number] Date of Birth: 06-Oct-1970 No Data Recorded  Encounter Date: 05/27/2015      PT End of Session - 05/27/15 0857    Visit Number 4   Number of Visits 9   Date for PT Re-Evaluation 05/27/15   PT Start Time 0848   PT Stop Time 0930   PT Time Calculation (min) 42 min   Activity Tolerance Patient tolerated treatment well   Behavior During Therapy [facility] for tasks assessed/performed      Past Medical History  Diagnosis Date  . History of chicken pox     Past Surgical History  Procedure Laterality Date  . Spine surgery  2009    diskectomy, L5 ([place])    There were no vitals filed for this visit.  Visit Diagnosis:  Weakness of left lower extremity  Abnormality of gait      Subjective Assessment - 05/27/15 1125    Subjective Patient states that he is doing well upon arrival to PT. He reports that his home program in going well and he feels like he has improved his core strength, but has only seen slight improvement in L ankle stability and strength.    Patient Stated Goals pt would like to learn how to live with his symptoms, improve his gait and decrease stress on his right knee from over compensating.     Currently in Pain? No/denies           Seated therex with blue tband : L Ankle DF 2x 20 L Ankle PF 2x20 L Ankle Inversion 2x20 PT provided min verbal instruction for improved quality of movement, increase ROM, and improve eccentric control with band exercises   L SLS 2x 30 seconds  BLE golfers lift 2 x 10 each LE Standing on airex with weight shift.  Posterior chain squat on aiex 2x 20 seconds  Squat to lift 35# box from floor x 10 L LE Step up on 5 inch step 2x 10  L LE forwardstep up to 6 inch step 2x10     Bridges x 10  Bridge with march x 10 BLE  Qped with arm lift 2x 10  Qped with donkey kick 2x 10  qped with arm and leg lift x 5  Side planks x 15 second bilaterally   Moderated Verbal and tactile instruction was provided with all exercises to improve exercise technique including improved use of hip and ankle strategy with step ups and SLS tacks, improved eccentric control of movement, increased core activation with lifting as well as improved breathing mechanics. Patient demonstrated improved technique and increased ankle and trunk control following instruction from PT.   Patient demonstrated success with increased HEP following instruction to continue to make gains following discharge.    Patient goals assessed. See below.  L Ankle DF ROM taken - 10 degrees.                          PT Education - 05/27/15 1600    Education provided Yes   Education Details HEP advanced. discharge plan   Person(s) Educated Patient   Methods Explanation   Comprehension Verbalized understanding             PT Long Term Goals - 05/27/15  0942    PT LONG TERM GOAL #1   Title pt will be independent with HEP to improve core strength for improved functional movements.   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 Patient  goals also assessed on this day. Patient demonstrated improve lifting ability and improved form to reduce stress on his back. PT assessed gait and noted decreased foot slap with gait. Patient demonstrated independence with all home exercises and improved ROM with ankle DF. Mild strength deficits noted in the R ankle PF, with inability to perform single LE calf raise on the L LE. Based on improvements in HEP and functional lifting. Based on improvement in gait, improve lifting mechanics and increased independence with HEP, continued skilled PT is not recommended at this time.     Pt will benefit from skilled therapeutic intervention in order to improve on the following deficits Abnormal gait;Decreased strength;Impaired sensation;Decreased range of motion;Increased fascial restricitons;Impaired flexibility   Rehab Potential Good   PT Frequency 2x / week   PT Duration 2 weeks   PT Treatment/Interventions Aquatic Therapy;Electrical Stimulation;Therapeutic exercise;Therapeutic activities;Functional mobility training;Stair training;Gait training;Balance training;Neuromuscular re-education;Manual techniques;Dry needling   PT Next Visit Plan increased HEP         Problem List Patient Active Problem List   Diagnosis Date Noted  . Left sided sciatica 05/12/2014   Barrie Folk SPT 05/27/2015   4:17 PM   Barrie Folk 05/27/2015, 4:17 PM  This entire session was performed under direct supervision and direction of a licensed therapist/therapist assistant . I have personally read, edited and approve of the note as written. Kerman Passey, PT, [place] MAIN [facility] SERVICES [address] [place], [place], [address] Phone: [phone]   Fax:  [phone]  Name: Evan Kelley MRN: [id number] Date of Birth: 06/07/1971

## 2015-05-27 NOTE — Patient Instructions (Addendum)
Forward Step Up    Stand facing step. Step up leading with left leg. Slowly step down leading with left leg. Perform __10 _ reps.  Copyright  VHI. All rights reserved.  Abductor Strength: Side Plank Pose, on Knees    Press down with bottom knee to lift hips. Hold for ___15-20 seconds . Repeat ___3_ times each side.  Copyright  VHI. All rights reserved.  Bracing With Bridging (Hook-Lying)    With neutral spine, tighten pelvic floor and abdominals and hold. Lift bottom. Repeat _10 __ times. Do __2_ times a day.   Copyright  VHI. All rights reserved.  Bridge Pose, One Leg    Bring knee to chest. Roll up from tailbone to bridge pose on supporting leg. Focus on engaging posterior hip muscles. Hold for ___2_ breaths. Repeat __10 __ times each leg.  Copyright  VHI. All rights reserved.  SINGLE LIMB STANCE    Stance: single leg on floor. Raise leg. Hold __30 _ seconds. Repeat with other leg. _3__ reps per set, _2__ sets per day, _7__ days per week  Copyright  VHI. All rights reserved.  Lateral Step Up    Stand to side of step. Step up leading with left leg. Slowly step down leading with opposite leg. Perform _10__ reps.  Copyright  VHI. All rights reserved.

## 2016-05-25 ENCOUNTER — Ambulatory Visit (INDEPENDENT_AMBULATORY_CARE_PROVIDER_SITE_OTHER): Payer: BLUE CROSS/BLUE SHIELD | Admitting: Family Medicine

## 2016-05-25 ENCOUNTER — Encounter: Payer: Self-pay | Admitting: Family Medicine

## 2016-05-25 VITALS — BP 110/80 | HR 65 | Temp 97.5°F | Ht 75.0 in | Wt 207.2 lb

## 2016-05-25 DIAGNOSIS — Z0001 Encounter for general adult medical examination with abnormal findings: Secondary | ICD-10-CM

## 2016-05-25 DIAGNOSIS — Z114 Encounter for screening for human immunodeficiency virus [HIV]: Secondary | ICD-10-CM | POA: Diagnosis not present

## 2016-05-25 DIAGNOSIS — R5383 Other fatigue: Secondary | ICD-10-CM | POA: Diagnosis not present

## 2016-05-25 DIAGNOSIS — Z1283 Encounter for screening for malignant neoplasm of skin: Secondary | ICD-10-CM | POA: Diagnosis not present

## 2016-05-25 DIAGNOSIS — Z13220 Encounter for screening for lipoid disorders: Secondary | ICD-10-CM | POA: Diagnosis not present

## 2016-05-25 DIAGNOSIS — Z0000 Encounter for general adult medical examination without abnormal findings: Secondary | ICD-10-CM

## 2016-05-25 LAB — CBC WITH DIFFERENTIAL/PLATELET
Basophils Absolute: 0 10*3/uL (ref 0.0–0.1)
Basophils Relative: 0.5 % (ref 0.0–3.0)
Eosinophils Absolute: 0.2 10*3/uL (ref 0.0–0.7)
Eosinophils Relative: 2.9 % (ref 0.0–5.0)
HCT: 46.3 % (ref 39.0–52.0)
Hemoglobin: 15.9 g/dL (ref 13.0–17.0)
Lymphocytes Relative: 34 % (ref 12.0–46.0)
Lymphs Abs: 2.3 10*3/uL (ref 0.7–4.0)
MCHC: 34.4 g/dL (ref 30.0–36.0)
MCV: 89 fl (ref 78.0–100.0)
Monocytes Absolute: 0.5 10*3/uL (ref 0.1–1.0)
Monocytes Relative: 7.6 % (ref 3.0–12.0)
Neutro Abs: 3.8 10*3/uL (ref 1.4–7.7)
Neutrophils Relative %: 55 % (ref 43.0–77.0)
Platelets: 216 10*3/uL (ref 150.0–400.0)
RBC: 5.2 Mil/uL (ref 4.22–5.81)
RDW: 12.6 % (ref 11.5–15.5)
WBC: 6.9 10*3/uL (ref 4.0–10.5)

## 2016-05-25 LAB — BASIC METABOLIC PANEL
BUN: 16 mg/dL (ref 6–23)
CO2: 28 mEq/L (ref 19–32)
Calcium: 9.9 mg/dL (ref 8.4–10.5)
Chloride: 105 mEq/L (ref 96–112)
Creatinine, Ser: 1.08 mg/dL (ref 0.40–1.50)
GFR: 79.26 mL/min (ref >60.00)
Glucose, Bld: 99 mg/dL (ref 70–99)
Potassium: 4.3 mEq/L (ref 3.5–5.1)
Sodium: 139 mEq/L (ref 135–145)

## 2016-05-25 LAB — HEPATIC FUNCTION PANEL
ALT: 32 U/L (ref 0–53)
AST: 26 U/L (ref 0–37)
Albumin: 4.6 g/dL (ref 3.5–5.2)
Alkaline Phosphatase: 73 U/L (ref 39–117)
Bilirubin, Direct: 0.1 mg/dL (ref 0.0–0.3)
Total Bilirubin: 0.7 mg/dL (ref 0.2–1.2)
Total Protein: 7.6 g/dL (ref 6.0–8.3)

## 2016-05-25 LAB — LIPID PANEL
Cholesterol: 185 mg/dL (ref 0–200)
HDL: 41 mg/dL (ref >39.00)
NonHDL: 144.29
Total CHOL/HDL Ratio: 5
Triglycerides: 232 mg/dL — ABNORMAL HIGH (ref 0.0–149.0)
VLDL: 46.4 mg/dL — ABNORMAL HIGH (ref 0.0–40.0)

## 2016-05-25 LAB — HIV ANTIBODY (ROUTINE TESTING W REFLEX): HIV 1&2 Ab, 4th Generation: NONREACTIVE

## 2016-05-25 LAB — LDL CHOLESTEROL, DIRECT: Direct LDL: 118 mg/dL

## 2016-05-25 NOTE — Progress Notes (Signed)
Dr. Frederico Hamman T. Marina Boerner, MD, [facility] Primary Care and Sports Medicine [place], [address] Phone: [phone] Fax: [phone]  05/25/2016  Patient: Evan Kelley, MRN: [id number], DOB: 1970/09/17, 45 y.o.  Primary Physician:  Owens Loffler, MD   Chief Complaint  Patient presents with  . Annual Exam   Subjective:   Evan Kelley is a 45 y.o. pleasant patient who presents with the following:  Preventative Health Maintenance Visit:  Health Maintenance Summary Reviewed and updated, unless pt declines services.  Tobacco History Reviewed. Alcohol: No concerns, no excessive use Exercise Habits: Doing elliptical now STD concerns: no risk or activity to increase risk Drug Use: None Encouraged self-testicular check  Health Maintenance  Topic Date Due  . HIV Screening  03/29/1988  . TETANUS/TDAP  03/29/1992  . INFLUENZA VACCINE  Completed   Immunization History  Administered Date(s) Administered  . Influenza,inj,Quad PF,36+ Mos 04/08/2015  . Influenza-Unspecified 04/27/2016   Patient Active Problem List   Diagnosis Date Noted  . Left sided sciatica 05/12/2014   Past Medical History:  Diagnosis Date  . History of chicken pox    Past Surgical History:  Procedure Laterality Date  . SPINE SURGERY  2009   diskectomy, L5 [place]a [place])   Social History   Social History  . Marital status: Married    Spouse name: N/A  . Number of children: N/A  . Years of education: N/A   Occupational History  . professor [employer]    Professor, Licensed conveyancer and Environmental Studies   Social History Main Topics  . Smoking status: Never Smoker  . Smokeless tobacco: Never Used  . Alcohol use Yes     Comment: occassional beer or wine  . Drug use: No  . Sexual activity: Not on file   Other Topics Concern  . Not on file   Social History Narrative  . No narrative on file   Family History  Problem Relation Age of Onset  . Alcohol abuse       parent  . Hyperlipidemia    . Coronary artery disease      GGF  . Hypertension     No Known Allergies  Medication list has been reviewed and updated.   General: Denies fever, chills, sweats. No significant weight loss. Eyes: Denies blurring,significant itching ENT: Denies earache, sore throat, and hoarseness. Cardiovascular: Denies chest pains, palpitations, dyspnea on exertion Respiratory: Denies cough, dyspnea at rest,wheeezing Breast: no concerns about lumps GI: Denies nausea, vomiting, diarrhea, constipation, change in bowel habits, abdominal pain, melena, hematochezia GU: Denies penile discharge, ED, urinary flow / outflow problems. No STD concerns. Musculoskeletal: BACK PAIN AND L LEG NUMBNESS Derm: Denies rash, itching Neuro: Denies  paresthesias, frequent falls, frequent headaches Psych: Denies depression, anxiety Endocrine: Denies cold intolerance, heat intolerance, polydipsia Heme: Denies enlarged lymph nodes Allergy: No hayfever  Objective:   BP 110/80   Pulse 65   Temp 97.5 F (36.4 C) (Oral)   Ht _0  (1.905 m)   Wt 207 lb 4 oz (94 kg)   BMI 25.90 kg/m  Ideal Body Weight: Weight in (lb) to have BMI = 25: 199.6  No exam data present  GEN: well developed, well nourished, no acute distress Eyes: conjunctiva and lids normal, PERRLA, EOMI ENT: TM clear, nares clear, oral exam WNL Neck: supple, no lymphadenopathy, no thyromegaly, no JVD Pulm: clear to auscultation and percussion, respiratory effort normal CV: regular rate and rhythm, S1-S2, no murmur, rub or gallop,  no bruits, peripheral pulses normal and symmetric, no cyanosis, clubbing, edema or varicosities GI: soft, non-tender; no hepatosplenomegaly, masses; active bowel sounds all quadrants GU: no hernia, testicular mass, penile discharge Lymph: no cervical, axillary or inguinal adenopathy MSK: gait normal, muscle tone and strength WNL, no joint swelling, effusions, discoloration, crepitus  SKIN:  clear, good turgor, color WNL, no rashes, lesions, or ulcerations Neuro: normal mental status, normal strength, sensation, and motion Psych: alert; oriented to person, place and time, normally interactive and not anxious or depressed in appearance. All labs reviewed with patient.  Lipids:    Component Value Date/Time   CHOL 182 04/02/2015 1509   TRIG 189.0 (H) 04/02/2015 1509   HDL 44.00 04/02/2015 1509   LDLDIRECT 97.4 09/14/2011 1436   VLDL 37.8 04/02/2015 1509   CHOLHDL 4 04/02/2015 1509   CBC: CBC Latest Ref Rng & Units 09/14/2011 02/21/2008  WBC 4.5 - 10.5 K/uL 7.5 6.8  Hemoglobin 13.0 - 17.0 g/dL 15.6 14.2  Hematocrit 39.0 - 52.0 % 44.9 40.7  Platelets 150.0 - 400.0 K/uL 234.0 016    Basic Metabolic Panel:    Component Value Date/Time   NA 142 04/02/2015 1509   K 4.0 04/02/2015 1509   CL 105 04/02/2015 1509   CO2 29 04/02/2015 1509   BUN 18 04/02/2015 1509   CREATININE 1.11 04/02/2015 1509   GLUCOSE 71 04/02/2015 1509   CALCIUM 9.7 04/02/2015 1509   Hepatic Function Latest Ref Rng & Units 04/02/2015 09/14/2011  Total Protein 6.0 - 8.3 g/dL 7.5 7.8  Albumin 3.5 - 5.2 g/dL 4.5 4.7  AST 0 - 37 U/L 31 25  ALT 0 - 53 U/L 32 31  Alk Phosphatase 39 - 117 U/L 67 85  Total Bilirubin 0.2 - 1.2 mg/dL 0.8 0.6  Bilirubin, Direct 0.0 - 0.3 mg/dL - 0.0    No results found for: TSH No results found for: PSA  Assessment and Plan:   Healthcare maintenance  Skin cancer screening - Plan: Ambulatory referral to Dermatology  Screening, lipid - Plan: Lipid panel  Screening for HIV (human immunodeficiency virus) - Plan: HIV antibody  Other fatigue - Plan: Basic metabolic panel, CBC with Differential/Platelet, Hepatic function panel  Health Maintenance Exam: The patient's preventative maintenance and recommended screening tests for an annual wellness exam were reviewed in full today. Brought up to date unless services declined.  Counselled on the importance of diet, exercise,  and its role in overall health and mortality. The patient's FH and SH was reviewed, including their home life, tobacco status, and drug and alcohol status.  Follow-up: No Follow-up on file. Unless noted, follow-up in 1 year for Health Maintenance Exam.  Orders Placed This Encounter  Procedures  . Lipid panel  . Basic metabolic panel  . CBC with Differential/Platelet  . Hepatic function panel  . HIV antibody  . Ambulatory referral to Dermatology    Signed,  Frederico Hamman T. Mychal Durio, MD   Patient's Medications  New Prescriptions   No medications on file  Previous Medications   IBUPROFEN (ADVIL,MOTRIN) 200 MG TABLET    Take 600 mg by mouth every 6 (six) hours as needed.   MULTIPLE VITAMIN (MULTIVITAMIN) TABLET    Take 1 tablet by mouth daily.  Modified Medications   No medications on file  Discontinued Medications   No medications on file

## 2016-05-25 NOTE — Patient Instructions (Signed)

## 2016-05-25 NOTE — Progress Notes (Signed)
Pre visit review using our clinic review tool, if applicable. No additional management support is needed unless otherwise documented below in the visit note. 

## 2019-06-07 ENCOUNTER — Other Ambulatory Visit: Payer: Self-pay

## 2019-06-07 ENCOUNTER — Ambulatory Visit: Payer: Self-pay

## 2019-06-07 DIAGNOSIS — Z23 Encounter for immunization: Secondary | ICD-10-CM

## 2019-08-14 DIAGNOSIS — M25652 Stiffness of left hip, not elsewhere classified: Secondary | ICD-10-CM | POA: Diagnosis not present

## 2019-08-14 DIAGNOSIS — R279 Unspecified lack of coordination: Secondary | ICD-10-CM | POA: Diagnosis not present

## 2019-08-14 DIAGNOSIS — M6281 Muscle weakness (generalized): Secondary | ICD-10-CM | POA: Diagnosis not present

## 2019-08-14 DIAGNOSIS — R262 Difficulty in walking, not elsewhere classified: Secondary | ICD-10-CM | POA: Diagnosis not present

## 2019-08-23 DIAGNOSIS — M6281 Muscle weakness (generalized): Secondary | ICD-10-CM | POA: Diagnosis not present

## 2019-08-23 DIAGNOSIS — R279 Unspecified lack of coordination: Secondary | ICD-10-CM | POA: Diagnosis not present

## 2019-08-23 DIAGNOSIS — M25652 Stiffness of left hip, not elsewhere classified: Secondary | ICD-10-CM | POA: Diagnosis not present

## 2019-08-23 DIAGNOSIS — R262 Difficulty in walking, not elsewhere classified: Secondary | ICD-10-CM | POA: Diagnosis not present

## 2019-09-06 DIAGNOSIS — M6281 Muscle weakness (generalized): Secondary | ICD-10-CM | POA: Diagnosis not present

## 2019-09-06 DIAGNOSIS — M25652 Stiffness of left hip, not elsewhere classified: Secondary | ICD-10-CM | POA: Diagnosis not present

## 2019-09-06 DIAGNOSIS — R262 Difficulty in walking, not elsewhere classified: Secondary | ICD-10-CM | POA: Diagnosis not present

## 2019-09-06 DIAGNOSIS — R279 Unspecified lack of coordination: Secondary | ICD-10-CM | POA: Diagnosis not present

## 2019-09-13 DIAGNOSIS — M25652 Stiffness of left hip, not elsewhere classified: Secondary | ICD-10-CM | POA: Diagnosis not present

## 2019-09-13 DIAGNOSIS — R279 Unspecified lack of coordination: Secondary | ICD-10-CM | POA: Diagnosis not present

## 2019-09-13 DIAGNOSIS — R262 Difficulty in walking, not elsewhere classified: Secondary | ICD-10-CM | POA: Diagnosis not present

## 2019-09-13 DIAGNOSIS — M6281 Muscle weakness (generalized): Secondary | ICD-10-CM | POA: Diagnosis not present

## 2019-09-18 DIAGNOSIS — M25652 Stiffness of left hip, not elsewhere classified: Secondary | ICD-10-CM | POA: Diagnosis not present

## 2019-09-18 DIAGNOSIS — R262 Difficulty in walking, not elsewhere classified: Secondary | ICD-10-CM | POA: Diagnosis not present

## 2019-09-18 DIAGNOSIS — R279 Unspecified lack of coordination: Secondary | ICD-10-CM | POA: Diagnosis not present

## 2019-09-18 DIAGNOSIS — M6281 Muscle weakness (generalized): Secondary | ICD-10-CM | POA: Diagnosis not present

## 2020-06-21 NOTE — Progress Notes (Signed)
Evan Kelley T. Evan Hegg, MD, [facility] Sports Medicine  Primary Care and Sports Medicine [facility] [address] [place], [address]  Phone: [phone]  FAX: [phone]  Evan Kelley - 49 y.o. male  MRN [id number]  Date of Birth: 03/16/1971  Date: 06/22/2020  PCP: Evan Beat, MD  Referral: Evan Beat, MD  Chief Complaint  Patient presents with  . Knee Pain    Right    This visit occurred during the SARS-CoV-2 public health emergency.  Safety protocols were in place, including screening questions prior to the visit, additional usage of staff PPE, and extensive cleaning of exam room while observing appropriate contact time as indicated for disinfecting solutions.   Subjective:   Evan Kelley is a 49 y.o. very pleasant male patient with Body mass index is 24.78 kg/m. who presents with the following:  He presents with some acute knee pain.  This is technically a new patient appointment since he has not been in our office since 2017.  He has no significant history of prior knee injury, including no history of fractures, dislocations, or operations.  Presents today with a 3-week history of some ongoing right-sided knee pain with a large effusion.  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  He has tried some Motrin as well as icing at home, and this is not really provided any bit of relief to his knee.  He does have some significant decrease of motion in the flexion and this also causes pain with terminal flexion.  Aspirate and inject his right knee  Review of Systems is noted in the HPI, as appropriate  Objective:   BP 120/80   Pulse 67   Temp 99.2 F (37.3 C) (Temporal)   Ht 6\' 3"  (1.905 m)   Wt 198 lb 4 oz (89.9 kg)   SpO2 98%   BMI 24.78 kg/m   GEN: No acute distress; alert,appropriate. PULM: Breathing comfortably in no  respiratory distress PSYCH: Normally interactive.   Right knee: Full extension.  Flexion to 90 degrees.  Large ballotable effusion.  No pain with loading the medial lateral patellar facets.  ACL PCL, MCL and LCL all are intact on exam.  He does have some posterior medial joint line tenderness, but not severe.  No lateral joint line tenderness.  Additional loading of the knee is equivocal given his large effusion.  Bounce home testing is negative.  Any type of forced flexion causes a significant amount of pain.  Laboratory and Imaging Data: X-rays: AP Bilateral Weight-bearing, Weightbearing Lateral, Sunrise views, view Indication: knee pain Findings:  Large effusion.  No fx, dislocations, or significant joint space loss. No djd.  Electronically Signed  By: Zoila Shutter, MD On: 06/22/2020 11:20 AM EST   Assessment and Plan:     ICD-10-CM   1. Acute pain of right knee  M25.561 DG Knee 4 Views W/Patella Right  2. Effusion of right knee joint  M25.461 DG Knee 4 Views W/Patella Right   Acute new internal derangement of the l knee Meniscal pathology is most likely Large effusion.  He does have baseline loss of neural function on the L leg, which contributes, and would argue for more aggressive POC.  For now, cont nsaids, ice, aspirate his knee with f/u  Aspiration/Injection Procedure Note Evan Kelley 05-23-1971 Date of procedure: 06/22/2020  Procedure: Large Joint  Aspiration / Injection with synovial fluid aspiration of knee, R Indications: Pain  Procedure Details Patient verbally consented; risks, benefits, and alternatives explained including possible infection. Patient prepped with Chloraprep. Ethyl chloride for anesthesia. 10 cc of 1% Lidocaine used in wheal then injected Subcutaneous fashion with 22 gauge needle on lateral approach. Under sterilne conditions, 18 gauge needle used via lateral approach to aspirate 55 cc of clear synovial fluid. Then 9 cc of  Lidocaine 1% and 1 mL of Kenalog 40 mg injected. Tolerated well, decreased pain, no complications. Medication: 1 mL of Kenalog 40 mg   Orders Placed This Encounter  Procedures  . DG Knee 4 Views W/Patella Right    Follow-up: Return f/u 3-4 weeks, r knee.  Signed,  [name], MD   Outpatient Encounter Medications as of 06/22/2020  Medication Sig  . ibuprofen (ADVIL,MOTRIN) 200 MG tablet Take 600 mg by mouth every 6 (six) hours as needed.  . Multiple Vitamin (MULTIVITAMIN) tablet Take 1 tablet by mouth daily.   No facility-administered encounter medications on file as of 06/22/2020.

## 2020-06-22 ENCOUNTER — Other Ambulatory Visit: Payer: Self-pay

## 2020-06-22 ENCOUNTER — Encounter: Payer: Self-pay | Admitting: Family Medicine

## 2020-06-22 ENCOUNTER — Ambulatory Visit: Payer: BC Managed Care – PPO | Admitting: Family Medicine

## 2020-06-22 ENCOUNTER — Ambulatory Visit (INDEPENDENT_AMBULATORY_CARE_PROVIDER_SITE_OTHER)
Admission: RE | Admit: 2020-06-22 | Discharge: 2020-06-22 | Disposition: A | Discharge status: Home / Self Care | Payer: BC Managed Care – PPO | Source: Ambulatory Visit | Attending: Family Medicine | Admitting: Family Medicine

## 2020-06-22 VITALS — BP 120/80 | HR 67 | Temp 99.2°F | Ht 75.0 in | Wt 198.2 lb

## 2020-06-22 DIAGNOSIS — M25561 Pain in right knee: Secondary | ICD-10-CM | POA: Diagnosis not present

## 2020-06-22 DIAGNOSIS — M25461 Effusion, right knee: Secondary | ICD-10-CM

## 2020-06-22 DIAGNOSIS — M7989 Other specified soft tissue disorders: Secondary | ICD-10-CM | POA: Diagnosis not present

## 2020-06-22 MED ORDER — TRIAMCINOLONE ACETONIDE 40 MG/ML IJ SUSP
40.0000 mg | Freq: Once | INTRAMUSCULAR | Status: AC
  Administered 2020-06-22: 40 mg via INTRA_ARTICULAR

## 2020-06-22 NOTE — Addendum Note (Signed)
Addended by: Damita Lack on: 06/22/2020 02:19 PM   Modules accepted: Orders

## 2020-07-22 ENCOUNTER — Ambulatory Visit: Payer: BC Managed Care – PPO | Admitting: Family Medicine

## 2020-07-22 DIAGNOSIS — Z0289 Encounter for other administrative examinations: Secondary | ICD-10-CM

## 2020-08-09 DIAGNOSIS — Z20822 Contact with and (suspected) exposure to covid-19: Secondary | ICD-10-CM | POA: Diagnosis not present

## 2020-10-06 DIAGNOSIS — H35711 Central serous chorioretinopathy, right eye: Secondary | ICD-10-CM | POA: Diagnosis not present

## 2021-07-06 DIAGNOSIS — Z23 Encounter for immunization: Secondary | ICD-10-CM | POA: Diagnosis not present

## 2022-01-05 ENCOUNTER — Telehealth: Payer: Self-pay | Admitting: Family Medicine

## 2022-01-05 NOTE — Telephone Encounter (Signed)
I think that would be okay.  I think for a new office visit, we could schedule him a few weeks out, but please do not do so on Monday.

## 2022-01-05 NOTE — Telephone Encounter (Signed)
Pt called and said his son Loa Socks is 63 and needs to set up with a PCP. He was wanting to know if Dr Lorelei Pont would be willing to take him on as a patient. He said the only thing he needs right now is a sports CPE and check out both of his ankles due to multiple injurys in the past, he said no current injury but is just thinking long term.   Would you be willing to take on his son or would he need to set up with a different PCP and then possibly just see you for the other issues? Please advise

## 2022-01-06 NOTE — Telephone Encounter (Signed)
Scheduled

## 2022-01-20 DIAGNOSIS — Z23 Encounter for immunization: Secondary | ICD-10-CM | POA: Diagnosis not present

## 2022-01-20 DIAGNOSIS — G8929 Other chronic pain: Secondary | ICD-10-CM | POA: Diagnosis not present

## 2022-01-20 DIAGNOSIS — M25571 Pain in right ankle and joints of right foot: Secondary | ICD-10-CM | POA: Diagnosis not present

## 2022-01-20 DIAGNOSIS — M79644 Pain in right finger(s): Secondary | ICD-10-CM | POA: Diagnosis not present

## 2022-03-03 DIAGNOSIS — Z23 Encounter for immunization: Secondary | ICD-10-CM | POA: Diagnosis not present

## 2022-06-21 DIAGNOSIS — Z23 Encounter for immunization: Secondary | ICD-10-CM | POA: Diagnosis not present

## 2022-06-27 IMAGING — DX DG KNEE COMPLETE 4+V*R*
4 series · 4 of 4 positions shown · non-contrast
Comparison: None.

CLINICAL DATA: Right knee pain, swelling

EXAM:
RIGHT KNEE - COMPLETE 4+ VIEW

[knee ap]
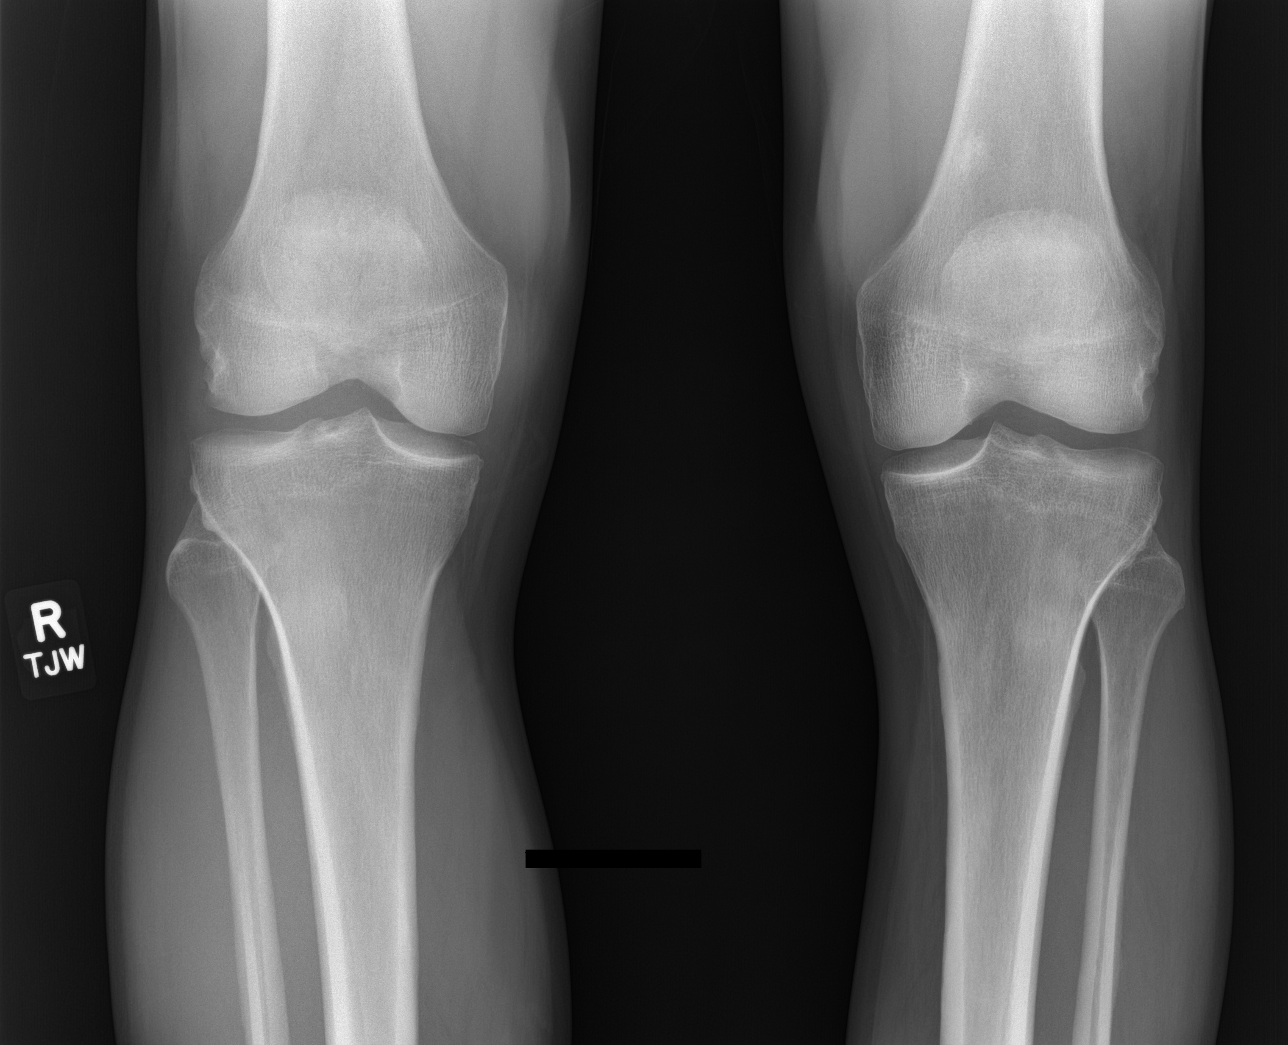

[knee tunnel]
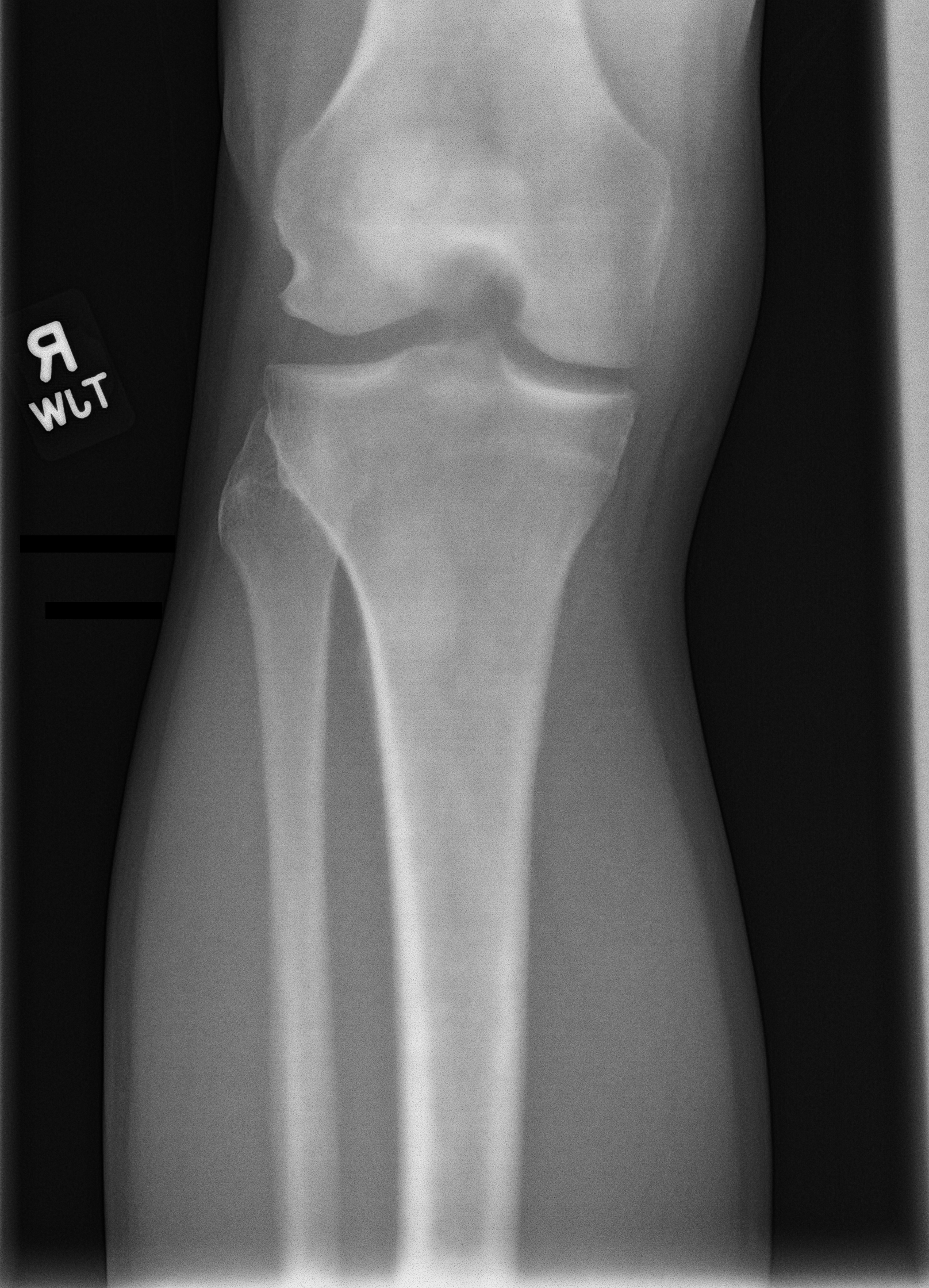

[knee lat]
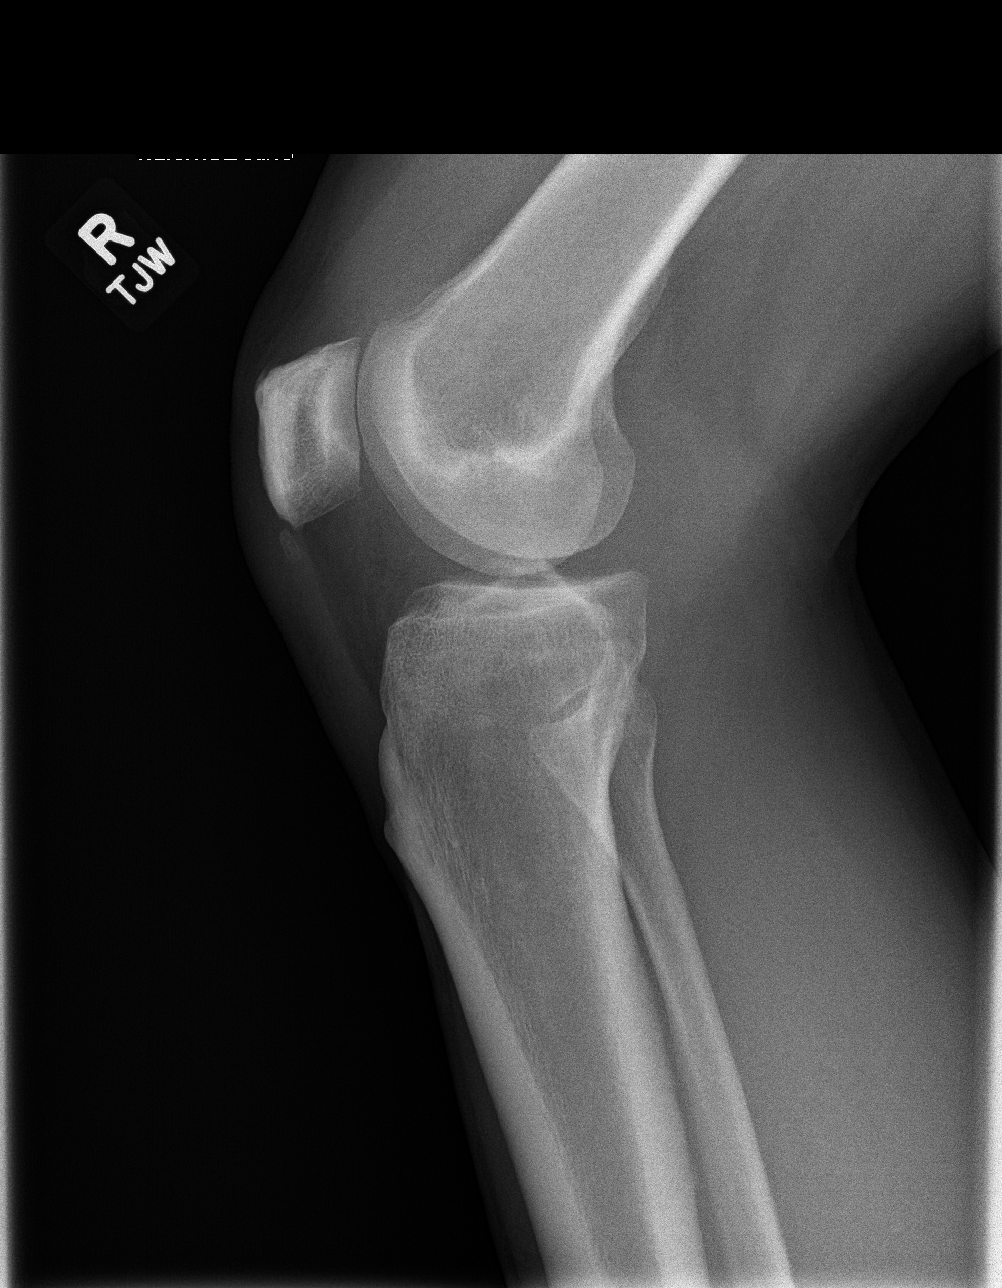

[patella skyline]
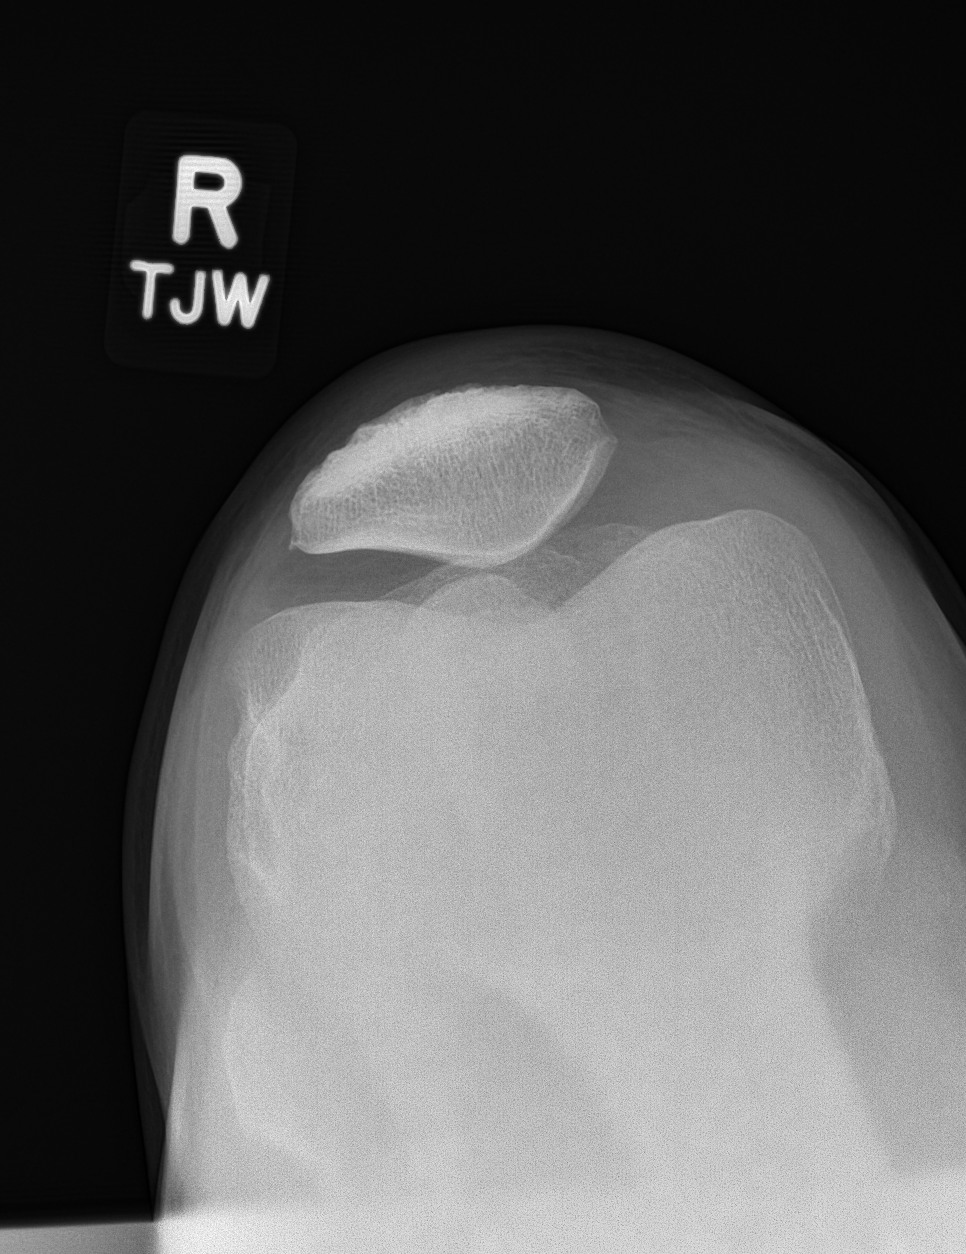

[4 of 4 positions shown; findings below may reference images not displayed]

FINDINGS: No acute fracture or dislocation. Moderate size knee joint effusion.
Knee joint spaces are maintained. Small ossification projecting
within the proximal patellar tendon. No focal soft tissue swelling.
IMPRESSION: 1. No acute osseous abnormality or significant arthropathy of the
right knee.
2. Moderate-sized knee joint effusion, nonspecific.

## 2022-10-12 DIAGNOSIS — M25562 Pain in left knee: Secondary | ICD-10-CM | POA: Diagnosis not present

## 2022-10-12 DIAGNOSIS — M25462 Effusion, left knee: Secondary | ICD-10-CM | POA: Diagnosis not present

## 2022-11-28 DIAGNOSIS — L247 Irritant contact dermatitis due to plants, except food: Secondary | ICD-10-CM | POA: Diagnosis not present

## 2023-02-07 NOTE — Progress Notes (Unsigned)
Evan Antigua T. Spring San, MD, CAQ Sports Medicine [facility] at [facility] [address] [place], [address]  Phone: [phone]  FAX: [phone]  Evan Kelley - 52 y.o. male  MRN [id number]  Date of Birth: 12/25/1970  Date: 02/08/2023  PCP: Evan Beat, MD  Referral: Evan Beat, MD  No chief complaint on file.  Patient Care Team: Evan Beat, MD as PCP - General (Family Medicine) Subjective:   Cope Kelley is a 52 y.o. pleasant patient who presents with the following:  Preventative Health Maintenance Visit:  Health Maintenance Summary Reviewed and updated, unless pt declines services.  Tobacco History Reviewed. Alcohol: No concerns, no excessive use Exercise Habits: Some activity, rec at least 30 mins 5 times a week STD concerns: no risk or activity to increase risk Drug Use: None  He actually has not had blood work in about 7 years Hep C screening Tdap Colonoscopy  Health Maintenance  Topic Date Due   Hepatitis C Screening  Never done   DTaP/Tdap/Td (1 - Tdap) Never done   Colonoscopy  Never done   INFLUENZA VACCINE  03/09/2023   COVID-19 Vaccine  Completed   HIV Screening  Completed   HPV VACCINES  Aged Out   Immunization History  Administered Date(s) Administered   Covid-19, Mrna,Vaccine(Spikevax)50yrs and older 06/21/2022   Influenza Inj Mdck Quad Pf 06/21/2022   Influenza,inj,Quad PF,6+ Mos 04/08/2015, 06/07/2019   Influenza-Unspecified 04/27/2016, 05/30/2017, 05/13/2020   Patient Active Problem List   Diagnosis Date Noted   Left sided sciatica 05/12/2014    Past Medical History:  Diagnosis Date   History of chicken pox     Past Surgical History:  Procedure Laterality Date   SPINE SURGERY  2009   diskectomy, L5 ([place])    Family History  Problem Relation Age of Onset   Alcohol abuse Other        parent   Hyperlipidemia Other    Coronary artery disease Other        GGF   Hypertension  Other     Social History   Social History Narrative   Not on file    Past Medical History, Surgical History, Social History, Family History, Problem List, Medications, and Allergies have been reviewed and updated if relevant.  Review of Systems: Pertinent positives are listed above.  Otherwise, a full 14 point review of systems has been done in full and it is negative except where it is noted positive.  Objective:   There were no vitals taken for this visit. Ideal Body Weight:    Ideal Body Weight:   No results found.     No data to display           GEN: well developed, well nourished, no acute distress Eyes: conjunctiva and lids normal, PERRLA, EOMI ENT: TM clear, nares clear, oral exam WNL Neck: supple, no lymphadenopathy, no thyromegaly, no JVD Pulm: clear to auscultation and percussion, respiratory effort normal CV: regular rate and rhythm, S1-S2, no murmur, rub or gallop, no bruits, peripheral pulses normal and symmetric, no cyanosis, clubbing, edema or varicosities GI: soft, non-tender; no hepatosplenomegaly, masses; active bowel sounds all quadrants GU: deferred Lymph: no cervical, axillary or inguinal adenopathy MSK: gait normal, muscle tone and strength WNL, no joint swelling, effusions, discoloration, crepitus  SKIN: clear, good turgor, color WNL, no rashes, lesions, or ulcerations Neuro: normal mental status, normal strength, sensation, and motion Psych: alert; oriented to person, place and time, normally interactive and  not anxious or depressed in appearance.  All labs reviewed with patient. Results for orders placed or performed in visit on 05/25/16  Lipid panel  Result Value Ref Range   Cholesterol 185 0 - 200 mg/dL   Triglycerides 960.4 (H) 0.0 - 149.0 mg/dL   HDL 54.09 >81.19 mg/dL   VLDL 14.7 (H) 0.0 - 82.9 mg/dL   Total CHOL/HDL Ratio 5    NonHDL 144.29   Basic metabolic panel  Result Value Ref Range   Sodium 139 135 - 145 mEq/L   Potassium  4.3 3.5 - 5.1 mEq/L   Chloride 105 96 - 112 mEq/L   CO2 28 19 - 32 mEq/L   Glucose, Bld 99 70 - 99 mg/dL   BUN 16 6 - 23 mg/dL   Creatinine, Ser 5.62 0.40 - 1.50 mg/dL   Calcium 9.9 8.4 - 13.0 mg/dL   GFR 86.57 >84.69 mL/min  CBC with Differential/Platelet  Result Value Ref Range   WBC 6.9 4.0 - 10.5 K/uL   RBC 5.20 4.22 - 5.81 Mil/uL   Hemoglobin 15.9 13.0 - 17.0 g/dL   HCT 62.9 52.8 - 41.3 %   MCV 89.0 78.0 - 100.0 fl   MCHC 34.4 30.0 - 36.0 g/dL   RDW 24.4 01.0 - 27.2 %   Platelets 216.0 150.0 - 400.0 K/uL   Neutrophils Relative % 55.0 43.0 - 77.0 %   Lymphocytes Relative 34.0 12.0 - 46.0 %   Monocytes Relative 7.6 3.0 - 12.0 %   Eosinophils Relative 2.9 0.0 - 5.0 %   Basophils Relative 0.5 0.0 - 3.0 %   Neutro Abs 3.8 1.4 - 7.7 K/uL   Lymphs Abs 2.3 0.7 - 4.0 K/uL   Monocytes Absolute 0.5 0.1 - 1.0 K/uL   Eosinophils Absolute 0.2 0.0 - 0.7 K/uL   Basophils Absolute 0.0 0.0 - 0.1 K/uL  Hepatic function panel  Result Value Ref Range   Total Bilirubin 0.7 0.2 - 1.2 mg/dL   Bilirubin, Direct 0.1 0.0 - 0.3 mg/dL   Alkaline Phosphatase 73 39 - 117 U/L   AST 26 0 - 37 U/L   ALT 32 0 - 53 U/L   Total Protein 7.6 6.0 - 8.3 g/dL   Albumin 4.6 3.5 - 5.2 g/dL  HIV antibody  Result Value Ref Range   HIV 1&2 Ab, 4th Generation NONREACTIVE NONREACTIVE  LDL cholesterol, direct  Result Value Ref Range   Direct LDL 118.0 mg/dL    Assessment and Plan:     ICD-10-CM   1. Healthcare maintenance  Z00.00       Health Maintenance Exam: The patient's preventative maintenance and recommended screening tests for an annual wellness exam were reviewed in full today. Brought up to date unless services declined.  Counselled on the importance of diet, exercise, and its role in overall health and mortality. The patient's FH and SH was reviewed, including their home life, tobacco status, and drug and alcohol status.  Follow-up in 1 year for physical exam or additional follow-up  below.  Disposition: No follow-ups on file.  No orders of the defined types were placed in this encounter.  There are no discontinued medications. No orders of the defined types were placed in this encounter.   Signed,  Elpidio Galea. Wyolene Weimann, MD   Allergies as of 02/08/2023   No Known Allergies      Medication List        Accurate as of February 07, 2023 12:35 PM. If you have any  questions, ask your nurse or doctor.          ibuprofen 200 MG tablet Commonly known as: ADVIL Take 600 mg by mouth every 6 (six) hours as needed.   multivitamin tablet Take 1 tablet by mouth daily.

## 2023-02-08 ENCOUNTER — Encounter: Payer: Self-pay | Admitting: Family Medicine

## 2023-02-08 ENCOUNTER — Ambulatory Visit (INDEPENDENT_AMBULATORY_CARE_PROVIDER_SITE_OTHER): Payer: BC Managed Care – PPO | Admitting: Family Medicine

## 2023-02-08 VITALS — BP 124/80 | HR 68 | Temp 97.9°F | Ht 75.25 in | Wt 185.0 lb

## 2023-02-08 DIAGNOSIS — Z125 Encounter for screening for malignant neoplasm of prostate: Secondary | ICD-10-CM | POA: Diagnosis not present

## 2023-02-08 DIAGNOSIS — Z0000 Encounter for general adult medical examination without abnormal findings: Secondary | ICD-10-CM

## 2023-02-08 DIAGNOSIS — R5383 Other fatigue: Secondary | ICD-10-CM

## 2023-02-08 DIAGNOSIS — Z13220 Encounter for screening for lipoid disorders: Secondary | ICD-10-CM | POA: Diagnosis not present

## 2023-02-08 DIAGNOSIS — Z131 Encounter for screening for diabetes mellitus: Secondary | ICD-10-CM | POA: Diagnosis not present

## 2023-02-08 DIAGNOSIS — Z23 Encounter for immunization: Secondary | ICD-10-CM | POA: Diagnosis not present

## 2023-02-08 DIAGNOSIS — Z1159 Encounter for screening for other viral diseases: Secondary | ICD-10-CM | POA: Diagnosis not present

## 2023-02-08 DIAGNOSIS — Z1211 Encounter for screening for malignant neoplasm of colon: Secondary | ICD-10-CM

## 2023-02-08 LAB — CBC WITH DIFFERENTIAL/PLATELET
Basophils Absolute: 0 10*3/uL (ref 0.0–0.1)
Basophils Relative: 0.9 % (ref 0.0–3.0)
Eosinophils Absolute: 0.2 10*3/uL (ref 0.0–0.7)
Eosinophils Relative: 3.5 % (ref 0.0–5.0)
HCT: 44.8 % (ref 39.0–52.0)
Hemoglobin: 15 g/dL (ref 13.0–17.0)
Lymphocytes Relative: 34.9 % (ref 12.0–46.0)
Lymphs Abs: 1.6 10*3/uL (ref 0.7–4.0)
MCHC: 33.5 g/dL (ref 30.0–36.0)
MCV: 92 fl (ref 78.0–100.0)
Monocytes Absolute: 0.3 10*3/uL (ref 0.1–1.0)
Monocytes Relative: 5.9 % (ref 3.0–12.0)
Neutro Abs: 2.5 10*3/uL (ref 1.4–7.7)
Neutrophils Relative %: 54.8 % (ref 43.0–77.0)
Platelets: 218 10*3/uL (ref 150.0–400.0)
RBC: 4.87 Mil/uL (ref 4.22–5.81)
RDW: 12.8 % (ref 11.5–15.5)
WBC: 4.5 10*3/uL (ref 4.0–10.5)

## 2023-02-08 LAB — BASIC METABOLIC PANEL
BUN: 12 mg/dL (ref 6–23)
CO2: 29 mEq/L (ref 19–32)
Calcium: 9.7 mg/dL (ref 8.4–10.5)
Chloride: 105 mEq/L (ref 96–112)
Creatinine, Ser: 0.99 mg/dL (ref 0.40–1.50)
GFR: 89.23 mL/min (ref >60.00)
Glucose, Bld: 94 mg/dL (ref 70–99)
Potassium: 3.9 mEq/L (ref 3.5–5.1)
Sodium: 140 mEq/L (ref 135–145)

## 2023-02-08 LAB — LIPID PANEL
Cholesterol: 199 mg/dL (ref 0–200)
HDL: 53.6 mg/dL (ref >39.00)
LDL Cholesterol: 118 mg/dL — ABNORMAL HIGH (ref 0–99)
NonHDL: 144.99
Total CHOL/HDL Ratio: 4
Triglycerides: 133 mg/dL (ref 0.0–149.0)
VLDL: 26.6 mg/dL (ref 0.0–40.0)

## 2023-02-08 LAB — HEMOGLOBIN A1C: Hgb A1c MFr Bld: 5.3 % (ref 4.6–6.5)

## 2023-02-08 LAB — HEPATIC FUNCTION PANEL
ALT: 27 U/L (ref 0–53)
AST: 27 U/L (ref 0–37)
Albumin: 4.8 g/dL (ref 3.5–5.2)
Alkaline Phosphatase: 68 U/L (ref 39–117)
Bilirubin, Direct: 0.2 mg/dL (ref 0.0–0.3)
Total Bilirubin: 1.2 mg/dL (ref 0.2–1.2)
Total Protein: 7.5 g/dL (ref 6.0–8.3)

## 2023-02-10 LAB — HEPATITIS C ANTIBODY: Hepatitis C Ab: NONREACTIVE

## 2023-02-10 LAB — PSA, TOTAL WITH REFLEX TO PSA, FREE: PSA, Total: 1.3 ng/mL (ref <=4.0)

## 2023-02-20 ENCOUNTER — Encounter: Payer: Self-pay | Admitting: *Deleted

## 2023-06-13 DIAGNOSIS — Z23 Encounter for immunization: Secondary | ICD-10-CM | POA: Diagnosis not present
